# Patient Record
Sex: Female | Born: 1975 | Race: White | Hispanic: No | Marital: Married | Smoking: Never smoker
Health system: Southern US, Community
[De-identification: ages and names within clinical notes are randomized; demographics above are authoritative.]

## PROBLEM LIST (undated history)

## (undated) DIAGNOSIS — L409 Psoriasis, unspecified: Secondary | ICD-10-CM

---

## 1998-01-20 ENCOUNTER — Other Ambulatory Visit: Admission: RE | Admit: 1998-01-20 | Discharge: 1998-01-20 | Payer: Self-pay | Admitting: Obstetrics and Gynecology

## 1998-11-26 ENCOUNTER — Other Ambulatory Visit: Admission: RE | Admit: 1998-11-26 | Discharge: 1998-11-26 | Payer: Self-pay | Admitting: Obstetrics and Gynecology

## 1999-06-14 ENCOUNTER — Inpatient Hospital Stay (HOSPITAL_COMMUNITY): Admission: AD | Admit: 1999-06-14 | Discharge: 1999-06-16 | Payer: Self-pay | Admitting: Obstetrics and Gynecology

## 1999-07-26 ENCOUNTER — Other Ambulatory Visit: Admission: RE | Admit: 1999-07-26 | Discharge: 1999-07-26 | Payer: Self-pay | Admitting: Obstetrics and Gynecology

## 2000-01-28 ENCOUNTER — Other Ambulatory Visit: Admission: RE | Admit: 2000-01-28 | Discharge: 2000-01-28 | Payer: Self-pay | Admitting: Obstetrics and Gynecology

## 2000-09-19 ENCOUNTER — Other Ambulatory Visit: Admission: RE | Admit: 2000-09-19 | Discharge: 2000-09-19 | Payer: Self-pay | Admitting: Obstetrics and Gynecology

## 2001-12-07 ENCOUNTER — Inpatient Hospital Stay (HOSPITAL_COMMUNITY): Admission: AD | Admit: 2001-12-07 | Discharge: 2001-12-07 | Payer: Self-pay | Admitting: Obstetrics and Gynecology

## 2001-12-30 ENCOUNTER — Inpatient Hospital Stay (HOSPITAL_COMMUNITY): Admission: AD | Admit: 2001-12-30 | Discharge: 2002-01-01 | Payer: Self-pay | Admitting: Obstetrics & Gynecology

## 2002-02-04 ENCOUNTER — Other Ambulatory Visit: Admission: RE | Admit: 2002-02-04 | Discharge: 2002-02-04 | Payer: Self-pay | Admitting: Obstetrics and Gynecology

## 2003-02-17 ENCOUNTER — Other Ambulatory Visit: Admission: RE | Admit: 2003-02-17 | Discharge: 2003-02-17 | Payer: Self-pay | Admitting: Obstetrics and Gynecology

## 2003-04-30 HISTORY — PX: TONSILLECTOMY: SUR1361

## 2004-06-17 ENCOUNTER — Other Ambulatory Visit: Admission: RE | Admit: 2004-06-17 | Discharge: 2004-06-17 | Payer: Self-pay | Admitting: Obstetrics and Gynecology

## 2005-07-28 ENCOUNTER — Other Ambulatory Visit: Admission: RE | Admit: 2005-07-28 | Discharge: 2005-07-28 | Payer: Self-pay | Admitting: Obstetrics and Gynecology

## 2005-09-16 ENCOUNTER — Encounter: Admission: RE | Admit: 2005-09-16 | Discharge: 2005-09-16 | Payer: Self-pay | Admitting: Internal Medicine

## 2013-02-26 ENCOUNTER — Other Ambulatory Visit: Payer: Self-pay | Admitting: Obstetrics and Gynecology

## 2013-02-26 DIAGNOSIS — R928 Other abnormal and inconclusive findings on diagnostic imaging of breast: Secondary | ICD-10-CM

## 2013-03-13 ENCOUNTER — Other Ambulatory Visit: Payer: Self-pay | Admitting: Obstetrics and Gynecology

## 2013-03-13 ENCOUNTER — Ambulatory Visit
Admission: RE | Admit: 2013-03-13 | Discharge: 2013-03-13 | Disposition: A | Discharge status: Home / Self Care | Payer: BC Managed Care – PPO | Source: Ambulatory Visit | Attending: Obstetrics and Gynecology | Admitting: Obstetrics and Gynecology

## 2013-03-13 DIAGNOSIS — R928 Other abnormal and inconclusive findings on diagnostic imaging of breast: Secondary | ICD-10-CM

## 2013-03-28 ENCOUNTER — Ambulatory Visit
Admission: RE | Admit: 2013-03-28 | Discharge: 2013-03-28 | Disposition: A | Discharge status: Home / Self Care | Payer: BC Managed Care – PPO | Source: Ambulatory Visit | Attending: Obstetrics and Gynecology | Admitting: Obstetrics and Gynecology

## 2013-03-28 DIAGNOSIS — R928 Other abnormal and inconclusive findings on diagnostic imaging of breast: Secondary | ICD-10-CM

## 2013-06-03 ENCOUNTER — Ambulatory Visit (INDEPENDENT_AMBULATORY_CARE_PROVIDER_SITE_OTHER): Payer: BC Managed Care – PPO | Admitting: *Deleted

## 2013-06-03 DIAGNOSIS — Z23 Encounter for immunization: Secondary | ICD-10-CM

## 2016-08-25 ENCOUNTER — Other Ambulatory Visit: Payer: Self-pay | Admitting: Gastroenterology

## 2016-08-25 DIAGNOSIS — R110 Nausea: Secondary | ICD-10-CM

## 2016-08-25 DIAGNOSIS — R1033 Periumbilical pain: Secondary | ICD-10-CM

## 2016-08-25 DIAGNOSIS — R1013 Epigastric pain: Secondary | ICD-10-CM

## 2016-09-01 ENCOUNTER — Ambulatory Visit
Admission: RE | Admit: 2016-09-01 | Discharge: 2016-09-01 | Disposition: A | Discharge status: Home / Self Care | Payer: BLUE CROSS/BLUE SHIELD | Source: Ambulatory Visit | Attending: Gastroenterology | Admitting: Gastroenterology

## 2016-09-01 DIAGNOSIS — R1033 Periumbilical pain: Secondary | ICD-10-CM

## 2016-09-01 DIAGNOSIS — R110 Nausea: Secondary | ICD-10-CM

## 2016-09-01 DIAGNOSIS — R1013 Epigastric pain: Secondary | ICD-10-CM

## 2016-09-01 MED ORDER — IOPAMIDOL (ISOVUE-300) INJECTION 61%
100.0000 mL | Freq: Once | INTRAVENOUS | Status: AC | PRN
  Administered 2016-09-01: 100 mL via INTRAVENOUS

## 2016-09-09 ENCOUNTER — Encounter: Payer: Self-pay | Admitting: Internal Medicine

## 2016-09-09 ENCOUNTER — Ambulatory Visit (INDEPENDENT_AMBULATORY_CARE_PROVIDER_SITE_OTHER): Payer: BLUE CROSS/BLUE SHIELD | Admitting: Internal Medicine

## 2016-09-09 VITALS — BP 120/66 | HR 87 | Ht 65.0 in | Wt 143.6 lb

## 2016-09-09 DIAGNOSIS — K589 Irritable bowel syndrome without diarrhea: Secondary | ICD-10-CM

## 2016-09-09 DIAGNOSIS — R911 Solitary pulmonary nodule: Secondary | ICD-10-CM | POA: Diagnosis not present

## 2016-09-09 NOTE — Patient Instructions (Addendum)
The clue to your pain is that it migrates to different locations and gets better when supine    Migratory with a very limited distribution of pain locations, daytime, not exacerbated by ex or coughing, worse in sitting position, associated with generalized abd bloating, not present supine due to the dome effect of the diaphragm is  canceled in that position. Frequently these patients have had multiple negative GI workups and CT scans.  Treatment consists of avoiding foods that cause gas (especially [place] food, boiled eggs, beans and raw vegetables like spinach and salads)  and citrucel 1 heaping tsp twice daily with a large glass of water.  Pain should improve w/in 2 weeks and if not then consider further GI work up.      We will call you in a year to schedule a repeat CT entire chest following the Fleischner Society Guidelines  If you start having pain with breathing or unexplained cough please call me in meantime

## 2016-09-09 NOTE — Progress Notes (Signed)
   Subjective:    Patient ID: Patricia Norris, female    DOB: 10-01-1975,     MRN: [id number]  HPI  [age] yowf only second hand smoker (husband) with w/u for abd pain x summer 2017 with CT abdomen> LLL spn so referred to pulmonary clinic 09/09/2016 by Dr   Julio Sicksarol Curtis     09/09/2016 1st [place] office visit/ Kerry Chisolm   Chief Complaint  Patient presents with  . Pulmonary Consult    Referred by Julio Sicksarol Curtis, NP for lung nodule.    abd pain is migratory and very rarely noticed supine and some better on GERD rx but otherwise neg w/u per Dr Loreta AveMann  No sob, cough or h/o rheumatologic dz   No  Assoc cp or chest tightness, subjective wheeze or overt sinus   symptoms. No unusual exp hx or h/o childhood pna/ asthma or knowledge of premature birth.  Sleeping ok without nocturnal  or early am exacerbation  of respiratory  c/o's or need for noct saba. Also denies any obvious fluctuation of symptoms with weather or environmental changes or other aggravating or alleviating factors except as outlined above   Current Medications, Allergies, Complete Past Medical History, Past Surgical History, Family History, and Social History were reviewed in [facility] electronic medical record.     Review of Systems  Constitutional: Negative for chills, fever and unexpected weight change.  HENT: Negative for congestion, dental problem, ear pain, nosebleeds, postnasal drip, rhinorrhea, sinus pressure, sneezing, sore throat, trouble swallowing and voice change.   Eyes: Negative for visual disturbance.  Respiratory: Negative for cough, choking and shortness of breath.   Cardiovascular: Negative for chest pain and leg swelling.  Gastrointestinal: Positive for abdominal pain. Negative for diarrhea and vomiting.       Acid heartburn  Indigestion  Genitourinary: Negative for difficulty urinating.  Musculoskeletal: Positive for arthralgias.  Skin: Negative for rash.  Neurological: Negative for tremors, syncope  and headaches.  Hematological: Does not bruise/bleed easily.       Objective:   Physical Exam  amb wf nad  Wt Readings from Last 3 Encounters:  09/09/16 143 lb 9.6 oz (65.1 kg)    Vital signs reviewed    HEENT: nl dentition, turbinates, and oropharynx. Nl external ear canals without cough reflex   NECK :  without JVD/Nodes/TM/ nl carotid upstrokes bilaterally   LUNGS: no acc muscle use,  Nl contour chest which is clear to A and P bilaterally without cough on insp or exp maneuvers   CV:  RRR  no s3 or murmur or increase in P2, nad no edema   ABD:  soft and nontender with nl inspiratory excursion in the supine position. No bruits or organomegaly appreciated, bowel sounds nl  MS:  Nl gait/ ext warm without deformities, calf tenderness, cyanosis - ? Very subtle clubbing  No obvious joint restrictions   SKIN: warm and dry without lesions    NEURO:  alert, approp, nl sensorium with  no motor or cerebellar deficits apparent.           Assessment & Plan:

## 2016-09-10 DIAGNOSIS — K589 Irritable bowel syndrome without diarrhea: Secondary | ICD-10-CM | POA: Insufficient documentation

## 2016-09-10 NOTE — Assessment & Plan Note (Signed)
The pain was desribed to me as migratory and never present supine, never with pleuritic component, so most likely IBS > diet/option to use citrucel reviewed, emphasized the abd pain has nothing to do with the spn esp as does not have dx of inflammatory bowel dz (which sometime can be assoc with non-specific CT findings   F/u PC/ GI planned - see avs for instructions unique to this ov

## 2016-09-10 NOTE — Assessment & Plan Note (Addendum)
CT abd 09/01/16  = 3 mm LLL/ passive smoker> f/u 09/01/17   CT results reviewed with pt >>> Too small for PET or bx, not suspicious enough for excisional bx > really only option for now is follow the Fleischner society guidelines as rec by radiology.   We did not scan the entire lung and it's reasonable to do so in one year but this is as aggressive a f/u that would be indicated even if she were at high risk, which she is not  Discussed in detail all the  indications, usual  risks and alternatives  relative to the benefits with patient who agrees to proceed with conservative f/u as outlined  With f/u ct in one year  Total time devoted to counseling  > 50 % of 45 min initial office evaluation/ consultation:  reviewed case with pt/ discussion of options/alternatives/ personally creating written customized instructions  in presence of pt  then going over those specific  Instructions directly with the pt including how to use all of the meds but in particular covering each new medication in detail and the difference between the maintenance/automatic meds and the prns using an action plan format for the latter.  Please see AVS from this visit for a full list of these instructions which I personally wrote for this pt and  are unique to this visit.

## 2017-08-03 ENCOUNTER — Other Ambulatory Visit: Payer: Self-pay | Admitting: Internal Medicine

## 2017-08-03 DIAGNOSIS — R911 Solitary pulmonary nodule: Secondary | ICD-10-CM

## 2017-09-01 ENCOUNTER — Ambulatory Visit (HOSPITAL_COMMUNITY): Payer: BLUE CROSS/BLUE SHIELD

## 2019-08-01 ENCOUNTER — Other Ambulatory Visit: Payer: Self-pay | Admitting: Obstetrics and Gynecology

## 2019-08-01 DIAGNOSIS — N644 Mastodynia: Secondary | ICD-10-CM

## 2019-08-14 ENCOUNTER — Other Ambulatory Visit: Payer: Self-pay

## 2019-08-14 ENCOUNTER — Ambulatory Visit
Admission: RE | Admit: 2019-08-14 | Discharge: 2019-08-14 | Disposition: A | Discharge status: Home / Self Care | Payer: BC Managed Care – PPO | Source: Ambulatory Visit | Attending: Obstetrics and Gynecology | Admitting: Obstetrics and Gynecology

## 2019-08-14 DIAGNOSIS — N644 Mastodynia: Secondary | ICD-10-CM

## 2020-09-09 ENCOUNTER — Encounter: Payer: Self-pay | Admitting: Emergency Medicine

## 2020-09-09 ENCOUNTER — Other Ambulatory Visit: Payer: Self-pay

## 2020-09-09 ENCOUNTER — Ambulatory Visit
Admission: EM | Admit: 2020-09-09 | Discharge: 2020-09-09 | Disposition: A | Discharge status: Home / Self Care | Payer: BC Managed Care – PPO | Attending: Family Medicine | Admitting: Family Medicine

## 2020-09-09 ENCOUNTER — Ambulatory Visit: Payer: Self-pay

## 2020-09-09 DIAGNOSIS — R059 Cough, unspecified: Secondary | ICD-10-CM | POA: Diagnosis not present

## 2020-09-09 DIAGNOSIS — R062 Wheezing: Secondary | ICD-10-CM

## 2020-09-09 DIAGNOSIS — B349 Viral infection, unspecified: Secondary | ICD-10-CM | POA: Diagnosis not present

## 2020-09-09 DIAGNOSIS — J3489 Other specified disorders of nose and nasal sinuses: Secondary | ICD-10-CM

## 2020-09-09 DIAGNOSIS — R0981 Nasal congestion: Secondary | ICD-10-CM | POA: Diagnosis not present

## 2020-09-09 DIAGNOSIS — Z1152 Encounter for screening for COVID-19: Secondary | ICD-10-CM

## 2020-09-09 DIAGNOSIS — J029 Acute pharyngitis, unspecified: Secondary | ICD-10-CM

## 2020-09-09 DIAGNOSIS — J069 Acute upper respiratory infection, unspecified: Secondary | ICD-10-CM

## 2020-09-09 HISTORY — DX: Psoriasis, unspecified: L40.9

## 2020-09-09 MED ORDER — FLUCONAZOLE 150 MG PO TABS: ORAL_TABLET | ORAL | 0 refills | Status: DC

## 2020-09-09 MED ORDER — ALBUTEROL SULFATE HFA 108 (90 BASE) MCG/ACT IN AERS
2.0000 | INHALATION_SPRAY | Freq: Once | RESPIRATORY_TRACT | Status: AC
  Administered 2020-09-09: 2 via RESPIRATORY_TRACT

## 2020-09-09 MED ORDER — PREDNISONE 10 MG (21) PO TBPK: ORAL_TABLET | Freq: Every day | ORAL | 0 refills | Status: AC

## 2020-09-09 MED ORDER — AMOXICILLIN-POT CLAVULANATE 875-125 MG PO TABS: 1.0000 | ORAL_TABLET | Freq: Two times a day (BID) | ORAL | 0 refills | Status: AC

## 2020-09-09 NOTE — Discharge Instructions (Addendum)
I have sent in a prednisone taper for you to take for 6 days. 6 tablets on day one, 5 tablets on day two, 4 tablets on day three, 3 tablets on day four, 2 tablets on day five, and 1 tablet on day six.  I have sent in Augmentin for you to take twice a day for 7 days. May get this filled if symptoms are persisting and Covid testing is negative.  I have sent in fluconazole in case of yeast. Take one tablet at the onset of symptoms. If symptoms are still present in 3 days, take the second tablet.   Your COVID test is pending.  You should self quarantine until the test result is back.    Take Tylenol or ibuprofen as needed for fever or discomfort.  Rest and keep yourself hydrated.    Follow-up with your primary care provider if your symptoms are not improving.

## 2020-09-09 NOTE — ED Triage Notes (Signed)
Pt presents with nasal congestion, cough, sore throat and sinus pressure x 3 days. Denies fever.

## 2020-09-09 NOTE — ED Provider Notes (Signed)
[facility]   [id number] 09/09/20 Arrival Time: 1044   CC: COVID symptoms  SUBJECTIVE: History from: patient.  Patricia Norris is a 45 y.o. female who presents with cough, nasal congestion, sinus pressure, wheezing, sore throat for the last 3 days. Denies sick exposure to COVID, flu or strep. Denies recent travel. Has negative history of Covid. Has completed Covid vaccines. Has not taken OTC medications for this. Cough and wheezing are worse with activity. Denies previous symptoms in the past. Denies fever, chills, fatigue, sinus pain, SOB, chest pain, nausea, changes in bowel or bladder habits.    ROS: As per HPI.  All other pertinent ROS negative.     Past Medical History:  Diagnosis Date  . Psoriasis    Past Surgical History:  Procedure Laterality Date  . TONSILLECTOMY  04/30/2003   No Known Allergies No current facility-administered medications on file prior to encounter.   Current Outpatient Medications on File Prior to Encounter  Medication Sig Dispense Refill  . Norethin-Eth Estrad-Fe Biphas (LO LOESTRIN FE PO) Take by mouth as directed.    Marland Kitchen omeprazole (PRILOSEC) 40 MG capsule Take 1 capsule by mouth daily.  11   Social History   Socioeconomic History  . Marital status: Married    Spouse name: Not on file  . Number of children: Not on file  . Years of education: Not on file  . Highest education level: Not on file  Occupational History  . Not on file  Tobacco Use  . Smoking status: Never Smoker  . Smokeless tobacco: Never Used  Substance and Sexual Activity  . Alcohol use: Not Currently  . Drug use: Never  . Sexual activity: Not on file  Other Topics Concern  . Not on file  Social History Narrative  . Not on file   Social Determinants of Health   Financial Resource Strain: Not on file  Food Insecurity: Not on file  Transportation Needs: Not on file  Physical Activity: Not on file  Stress: Not on file  Social Connections: Not on file   Intimate Partner Violence: Not on file   Family History  Problem Relation Age of Onset  . Colon cancer Maternal Aunt     OBJECTIVE:  Vitals:   09/09/20 1110 09/09/20 1114 09/09/20 1117  BP:  128/78   Pulse:  72   Resp:  20   Temp:  98.9 F (37.2 C)   TempSrc:  Oral   SpO2:   97%  Weight: 140 lb (63.5 kg)    Height: 5\' 5"  (1.651 m)       General appearance: alert; appears fatigued, but nontoxic; speaking in full sentences and tolerating own secretions HEENT: NCAT; Ears: EACs clear, TMs pearly gray; Eyes: PERRL.  EOM grossly intact. Sinuses: nontender; Nose: nares patent with clear rhinorrhea, Throat: oropharynx erythematous, cobblestoning present, tonsils non erythematous or enlarged, uvula midline  Neck: supple without LAD Lungs: unlabored respirations, symmetrical air entry; cough: mild; no respiratory distress; diffuse wheezing throughout bilateral lung fields Heart: regular rate and rhythm.  Radial pulses 2+ symmetrical bilaterally Skin: warm and dry Psychological: alert and cooperative; normal mood and affect  LABS:  No results found for this or any previous visit (from the past 24 hour(s)).   ASSESSMENT & PLAN:  1. Viral illness   2. Encounter for screening for COVID-19   3. Nasal congestion   4. Cough   5. Sore throat   6. Sinus pressure   7. Wheezing   8.  Upper respiratory tract infection, unspecified type     Meds ordered this encounter  Medications  . predniSONE (STERAPRED UNI-PAK 21 TAB) 10 MG (21) TBPK tablet    Sig: Take by mouth daily for 6 days. Take 6 tablets on day 1, 5 tablets on day 2, 4 tablets on day 3, 3 tablets on day 4, 2 tablets on day 5, 1 tablet on day 6    Dispense:  21 tablet    Refill:  0    Order Specific Question:   Supervising Provider    Answer:   Merrilee Jansky [id number]  . amoxicillin-clavulanate (AUGMENTIN) 875-125 MG tablet    Sig: Take 1 tablet by mouth 2 (two) times daily for 7 days.    Dispense:  14 tablet     Refill:  0    Fill 09/12/20    Order Specific Question:   Supervising Provider    Answer:   Merrilee Jansky [[id number]]  . fluconazole (DIFLUCAN) 150 MG tablet    Sig: Take one tablet at the onset of symptoms. If symptoms are still present 3 days later, take the second tablet.    Dispense:  2 tablet    Refill:  0    Order Specific Question:   Supervising Provider    Answer:   Merrilee Jansky [id number]  . albuterol (VENTOLIN HFA) 108 (90 Base) MCG/ACT inhaler 2 puff   Prescribed steroid taper Albuterol inhaler given in office today Prescribed Augmentin to get field on 09/12/2020, if symptoms are persisting and COVID is negative Prescribed fluconazole in case of yeast Continue supportive care at home COVID and flu testing ordered.  It will take between 2-3 days for test results. Someone will contact you regarding abnormal results.   Work note provided Patient should remain in quarantine until they have received Covid results.  If negative you may resume normal activities (go back to work/school) while practicing hand hygiene, social distance, and mask wearing.  If positive, patient should remain in quarantine for at least 5 days from symptom onset AND greater than 72 hours after symptoms resolution (absence of fever without the use of fever-reducing medication and improvement in respiratory symptoms), whichever is longer Get plenty of rest and push fluids Use OTC zyrtec for nasal congestion, runny nose, and/or sore throat Use OTC flonase for nasal congestion and runny nose Use medications daily for symptom relief Use OTC medications like ibuprofen or tylenol as needed fever or pain Call or go to the ED if you have any new or worsening symptoms such as fever, worsening cough, shortness of breath, chest tightness, chest pain, turning blue, changes in mental status.  Reviewed expectations re: course of current medical issues. Questions answered. Outlined signs and symptoms indicating need for  more acute intervention. Patient verbalized understanding. After Visit Summary given.         Moshe Cipro, NP 09/09/20 1157

## 2020-09-11 LAB — NOVEL CORONAVIRUS, NAA: SARS-CoV-2, NAA: NOT DETECTED

## 2020-09-11 LAB — SARS-COV-2, NAA 2 DAY TAT

## 2020-11-12 ENCOUNTER — Telehealth: Payer: Self-pay | Admitting: Orthopaedic Surgery

## 2020-11-12 NOTE — Telephone Encounter (Signed)
(  cont'd) Patient inquired about appointment; in discussing it further, patient relayed she had been seen for this back issue in the past by [facility]); said she will go ahead and call them to request a sooner appointment. Will call back if unable to be scheduled soon there.

## 2020-11-12 NOTE — Telephone Encounter (Signed)
Patient called with question regarding a back pain issue she has been having; states fell many years ago, and said it is hurting  again

## 2021-02-18 ENCOUNTER — Other Ambulatory Visit (HOSPITAL_COMMUNITY)
Admission: RE | Admit: 2021-02-18 | Discharge: 2021-02-18 | Disposition: A | Discharge status: Home / Self Care | Payer: BC Managed Care – PPO | Source: Ambulatory Visit | Attending: Dermatology | Admitting: Dermatology

## 2021-02-18 ENCOUNTER — Other Ambulatory Visit: Payer: Self-pay

## 2021-02-18 DIAGNOSIS — L400 Psoriasis vulgaris: Secondary | ICD-10-CM | POA: Insufficient documentation

## 2021-02-18 DIAGNOSIS — Z79899 Other long term (current) drug therapy: Secondary | ICD-10-CM | POA: Diagnosis present

## 2021-02-22 LAB — QUANTIFERON-TB GOLD PLUS: QuantiFERON-TB Gold Plus: NEGATIVE

## 2021-02-22 LAB — QUANTIFERON-TB GOLD PLUS (RQFGPL)
QuantiFERON Mitogen Value: >10 IU/mL
QuantiFERON Nil Value: 0.04 IU/mL
QuantiFERON TB1 Ag Value: 0.02 IU/mL
QuantiFERON TB2 Ag Value: 0 IU/mL

## 2021-04-21 IMAGING — MG DIGITAL DIAGNOSTIC BILAT W/ TOMO W/ CAD
6 of 10 series · 6 of 30 positions shown · non-contrast
Comparison: Previous exam(s).

CLINICAL DATA: Patient complains of focal pain in the 10 o'clock
region of the right breast.

EXAM:
DIGITAL DIAGNOSTIC BILATERAL MAMMOGRAM WITH TOMO
ULTRASOUND RIGHT BREAST

[L CC synth-2D]
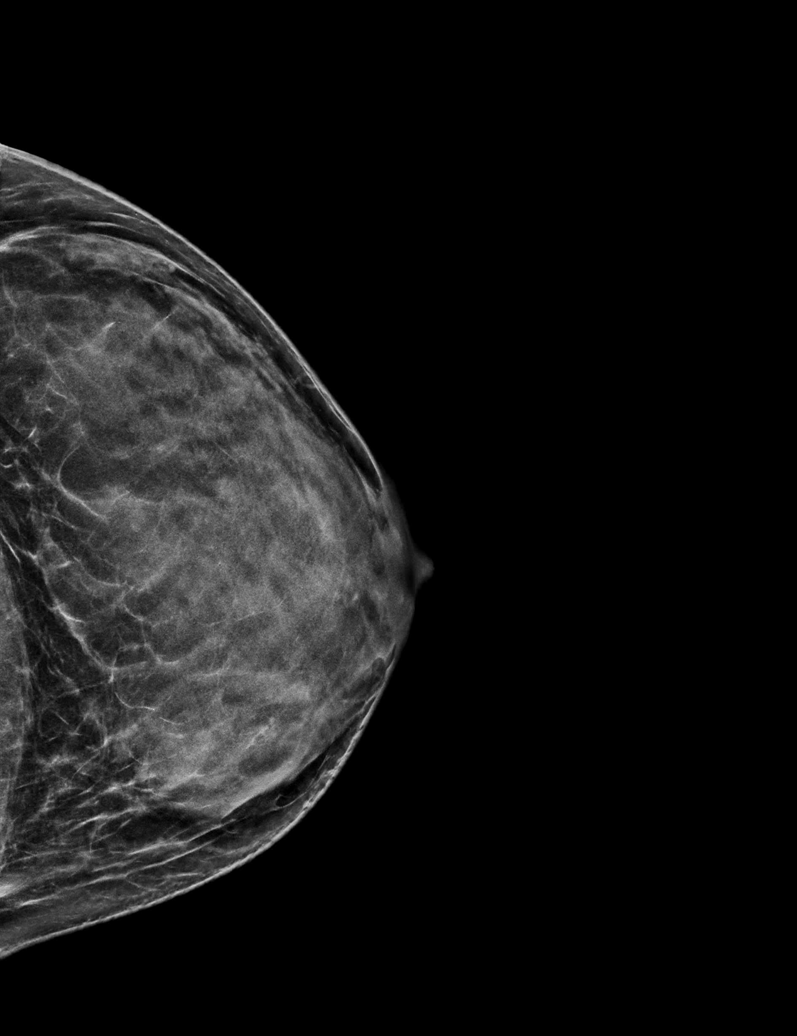

[R TAN synth-2D]
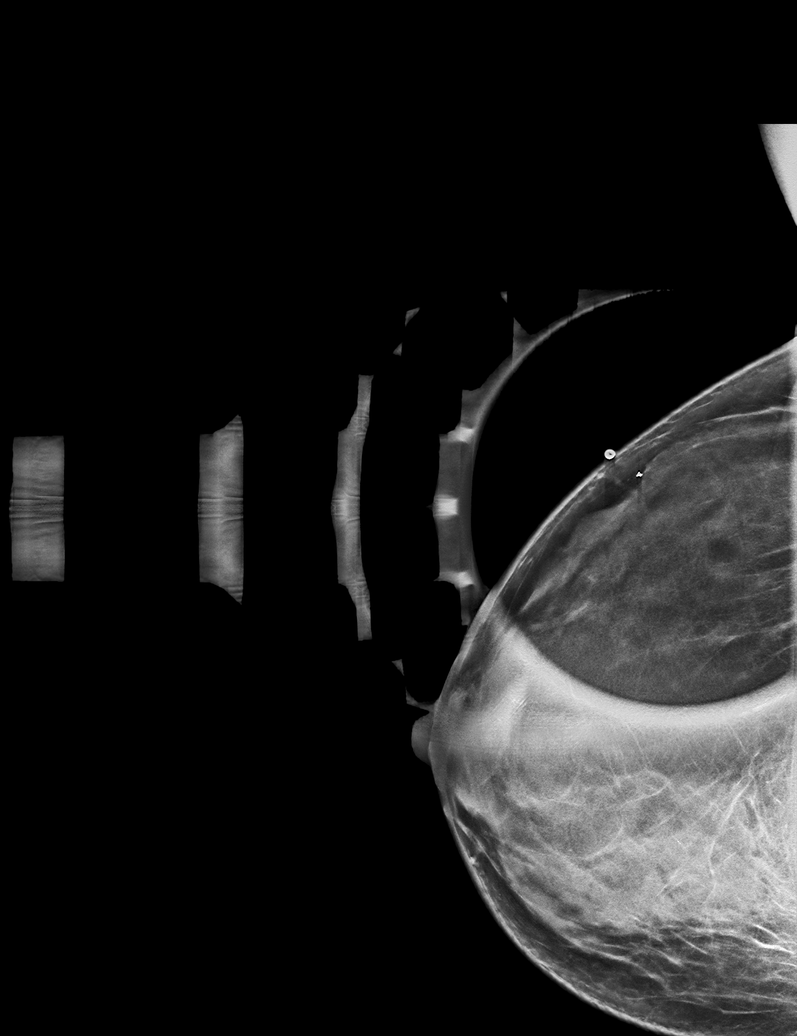

[R CC synth-2D]
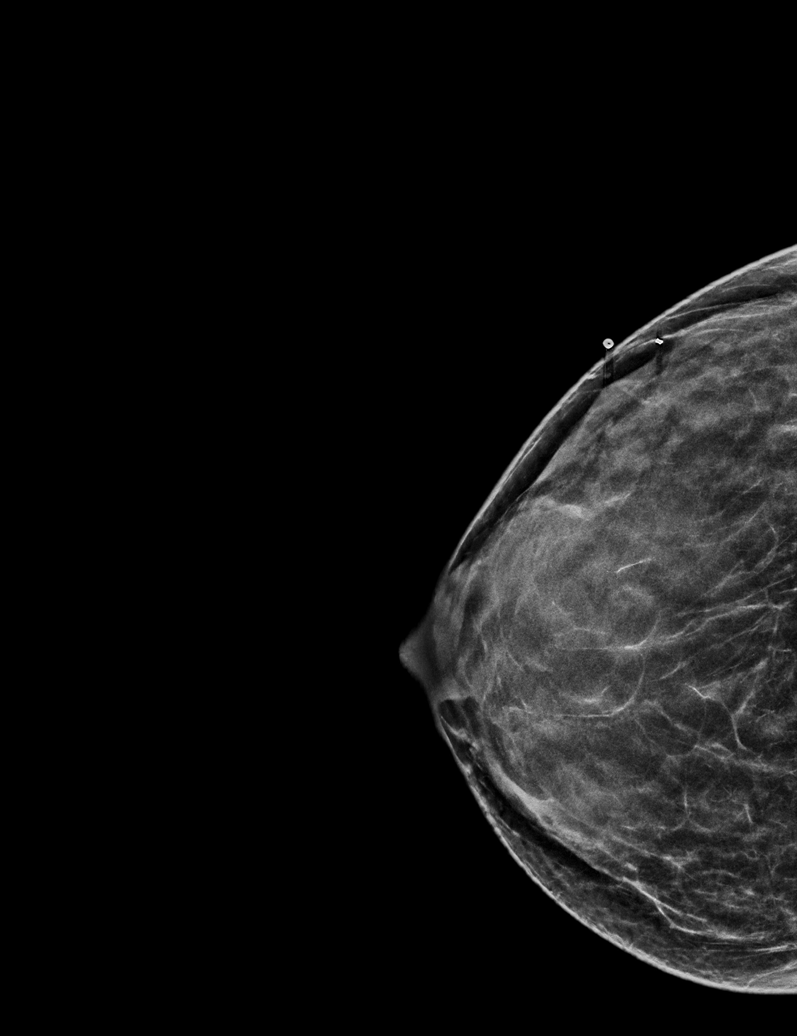

[L MLO synth-2D]
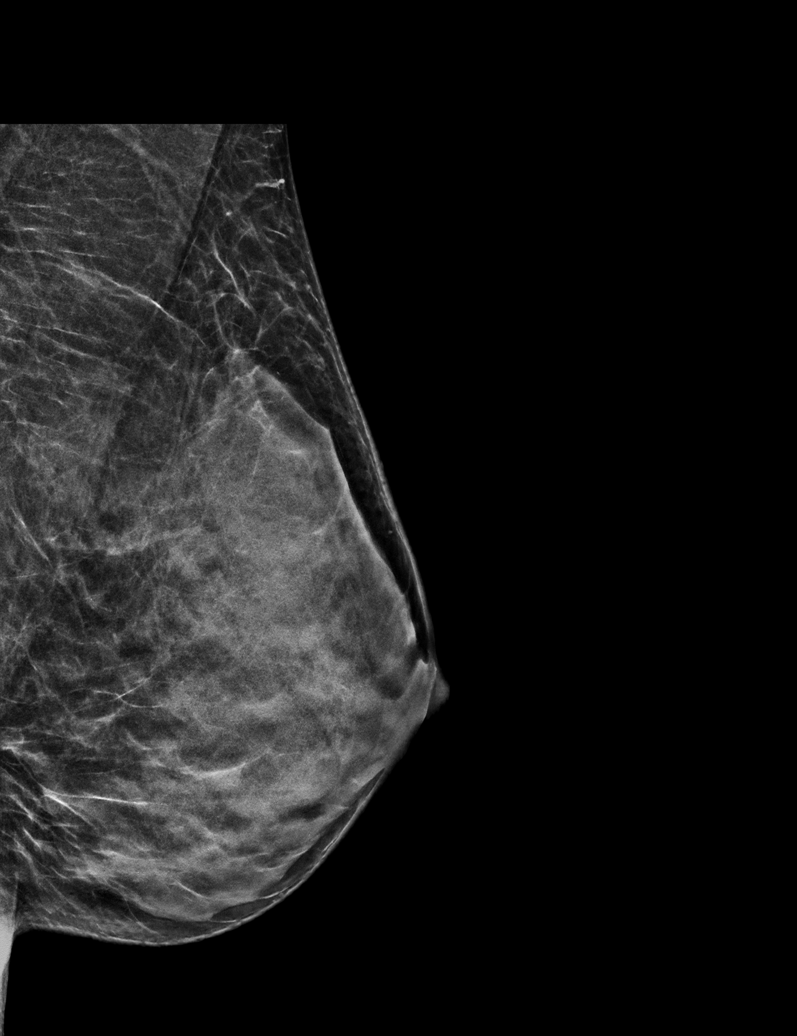

[R MLO synth-2D]
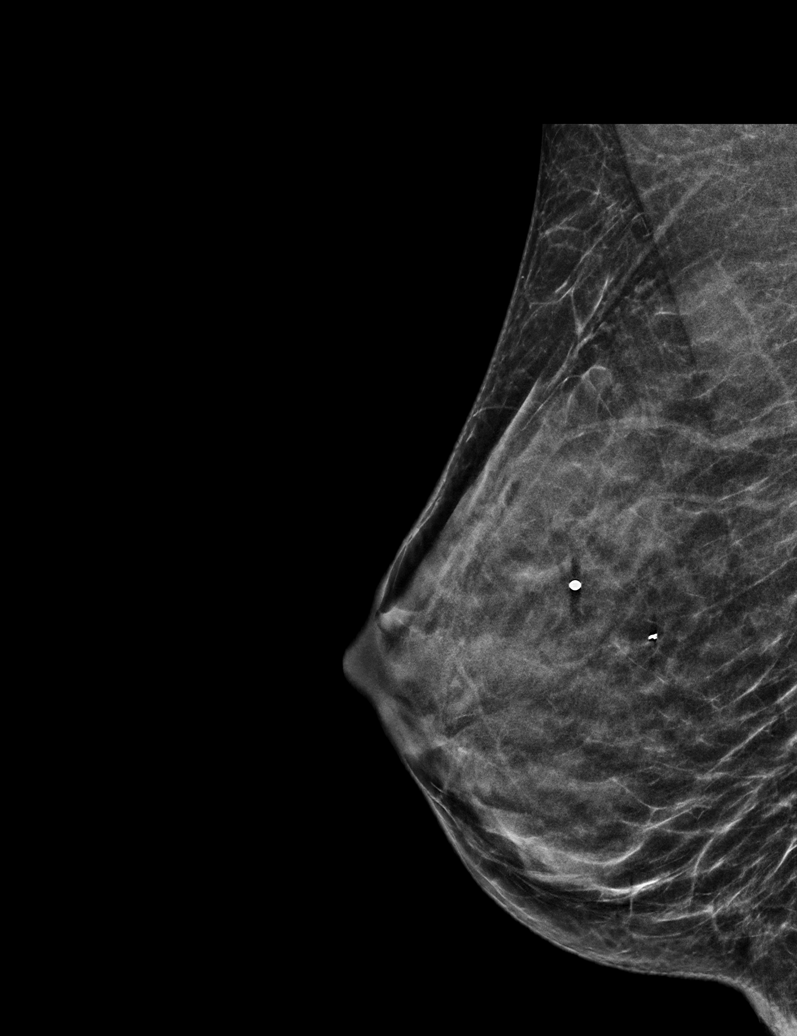

[L MLO tomo · tomo slice 25/48.0]
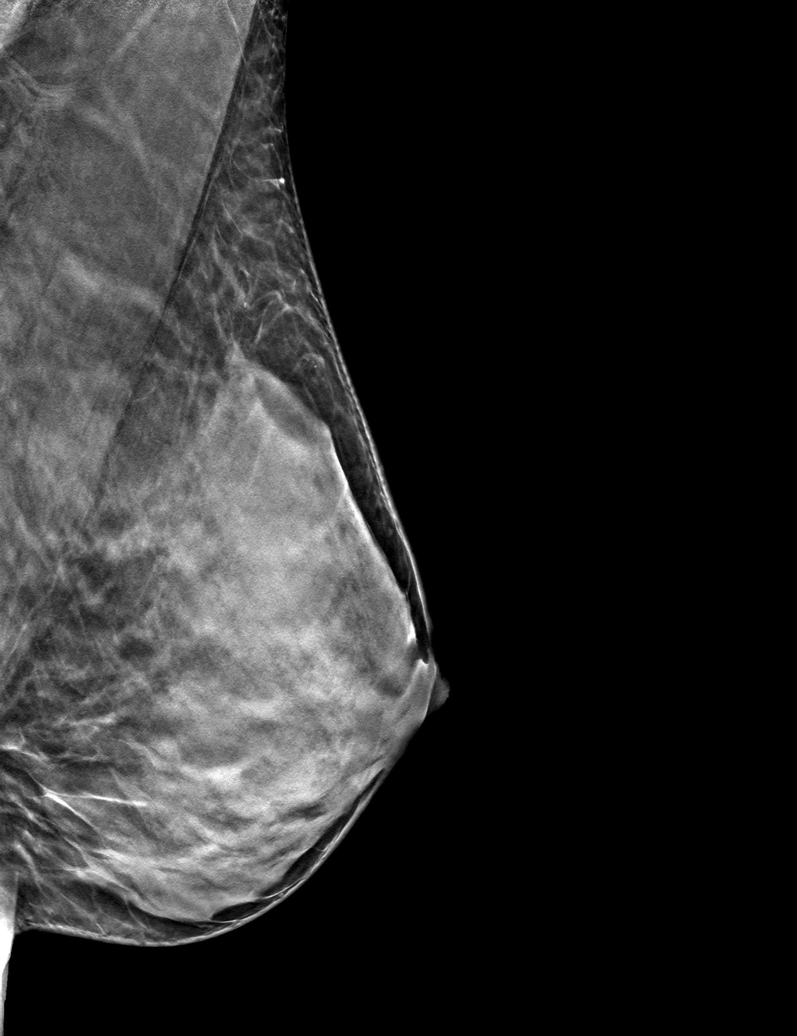

[6 of 30 positions shown; findings below may reference images not displayed]

ACR Breast Density Category d: The breast tissue is extremely dense,
which lowers the sensitivity of mammography.
FINDINGS: No suspicious mass, malignant type microcalcifications or distortion
detected in either breast.

On physical exam, I do not palpate a mass in the 10 o'clock region
of the right breast 4 cm from the nipple.

Targeted ultrasound is performed, showing normal tissue in the area
of clinical concern in the right breast at 10 o'clock 4 cm from the
nipple.
IMPRESSION: No evidence of malignancy in either breast.

RECOMMENDATION:
Bilateral screening mammogram in 1 year is recommended.

I have discussed the findings and recommendations with the patient.
If applicable, a reminder letter will be sent to the patient
regarding the next appointment.

BI-RADS CATEGORY  1: Negative.

## 2021-06-02 ENCOUNTER — Encounter: Payer: Self-pay | Admitting: Emergency Medicine

## 2021-06-02 ENCOUNTER — Ambulatory Visit
Admission: EM | Admit: 2021-06-02 | Discharge: 2021-06-02 | Disposition: A | Discharge status: Home / Self Care | Payer: BC Managed Care – PPO | Attending: Family Medicine | Admitting: Family Medicine

## 2021-06-02 ENCOUNTER — Other Ambulatory Visit: Payer: Self-pay

## 2021-06-02 DIAGNOSIS — J0110 Acute frontal sinusitis, unspecified: Secondary | ICD-10-CM

## 2021-06-02 MED ORDER — AMOXICILLIN 875 MG PO TABS: 875.0000 mg | ORAL_TABLET | Freq: Two times a day (BID) | ORAL | 0 refills | Status: AC

## 2021-06-02 NOTE — ED Triage Notes (Signed)
Pt here for facial pain and sinus pressure x 2 weeks with hx of sinus infection is past

## 2021-06-05 NOTE — ED Provider Notes (Signed)
  [facility]   [id number] 06/02/21 Arrival Time: 1149  ASSESSMENT & PLAN:  1. Acute non-recurrent frontal sinusitis    Begin: Meds ordered this encounter  Medications   amoxicillin (AMOXIL) 875 MG tablet    Sig: Take 1 tablet (875 mg total) by mouth 2 (two) times daily for 10 days.    Dispense:  20 tablet    Refill:  0   Discussed typical duration of symptoms. OTC symptom care as needed. Ensure adequate fluid intake and rest.   Follow-up Information     [facility].   Specialty: Urgent Care Why: As needed. Contact information: [address], [address] [place] [address] [phone]                Reviewed expectations re: course of current medical issues. Questions answered. Outlined signs and symptoms indicating need for more acute intervention. Patient verbalized understanding. After Visit Summary given.   SUBJECTIVE: History from: patient.  Patricia Norris is a 45 y.o. female who presents with complaint of nasal congestion, post-nasal drainage, and sinus pain. Onset gradual,  2 w ago . Respiratory symptoms: none. Fever: absent. Overall normal PO intake without n/v. OTC treatment: without relief. Seasonal allergies: no. History of frequent sinus infections: no. No specific aggravating or alleviating factors reported.  Social History   Tobacco Use  Smoking Status Never  Smokeless Tobacco Never    OBJECTIVE:  Vitals:   06/02/21 1344  BP: 120/69  Pulse: 73  Resp: 16  Temp: 98.6 F (37 C)  TempSrc: Oral  SpO2: 94%     General appearance: alert; no distress HEENT: nasal congestion; clear runny nose; throat irritation secondary to post-nasal drainage; frontal tenderness to palpation; turbinates boggy Neck: supple without LAD; trachea midline Lungs: unlabored respirations, symmetrical air entry; cough: absent; no respiratory distress Skin: warm and dry Psychological: alert and cooperative;  normal mood and affect  No Known Allergies  Past Medical History:  Diagnosis Date   Psoriasis    Family History  Problem Relation Age of Onset   Colon cancer Maternal Aunt    Social History   Socioeconomic History   Marital status: Married    Spouse name: Not on file   Number of children: Not on file   Years of education: Not on file   Highest education level: Not on file  Occupational History   Not on file  Tobacco Use   Smoking status: Never   Smokeless tobacco: Never  Substance and Sexual Activity   Alcohol use: Not Currently   Drug use: Never   Sexual activity: Not on file  Other Topics Concern   Not on file  Social History Narrative   Not on file   Social Determinants of Health   Financial Resource Strain: Not on file  Food Insecurity: Not on file  Transportation Needs: Not on file  Physical Activity: Not on file  Stress: Not on file  Social Connections: Not on file  Intimate Partner Violence: Not on file             Mardella Layman, MD 06/05/21 1032

## 2021-06-16 DIAGNOSIS — N926 Irregular menstruation, unspecified: Secondary | ICD-10-CM | POA: Diagnosis not present

## 2021-06-16 DIAGNOSIS — F411 Generalized anxiety disorder: Secondary | ICD-10-CM | POA: Diagnosis not present

## 2021-06-16 DIAGNOSIS — G4700 Insomnia, unspecified: Secondary | ICD-10-CM | POA: Diagnosis not present

## 2021-11-09 DIAGNOSIS — J0110 Acute frontal sinusitis, unspecified: Secondary | ICD-10-CM | POA: Diagnosis not present

## 2021-11-09 DIAGNOSIS — R110 Nausea: Secondary | ICD-10-CM | POA: Diagnosis not present

## 2021-11-09 DIAGNOSIS — J302 Other seasonal allergic rhinitis: Secondary | ICD-10-CM | POA: Diagnosis not present

## 2021-12-13 DIAGNOSIS — Z1231 Encounter for screening mammogram for malignant neoplasm of breast: Secondary | ICD-10-CM | POA: Diagnosis not present

## 2021-12-13 DIAGNOSIS — Z1321 Encounter for screening for nutritional disorder: Secondary | ICD-10-CM | POA: Diagnosis not present

## 2021-12-13 DIAGNOSIS — Z124 Encounter for screening for malignant neoplasm of cervix: Secondary | ICD-10-CM | POA: Diagnosis not present

## 2021-12-13 DIAGNOSIS — Z01419 Encounter for gynecological examination (general) (routine) without abnormal findings: Secondary | ICD-10-CM | POA: Diagnosis not present

## 2021-12-13 DIAGNOSIS — Z130 Encounter for screening for diseases of the blood and blood-forming organs and certain disorders involving the immune mechanism: Secondary | ICD-10-CM | POA: Diagnosis not present

## 2021-12-13 DIAGNOSIS — Z6827 Body mass index (BMI) 27.0-27.9, adult: Secondary | ICD-10-CM | POA: Diagnosis not present

## 2021-12-13 DIAGNOSIS — Z1329 Encounter for screening for other suspected endocrine disorder: Secondary | ICD-10-CM | POA: Diagnosis not present

## 2021-12-13 DIAGNOSIS — Z131 Encounter for screening for diabetes mellitus: Secondary | ICD-10-CM | POA: Diagnosis not present

## 2021-12-13 DIAGNOSIS — Z13228 Encounter for screening for other metabolic disorders: Secondary | ICD-10-CM | POA: Diagnosis not present

## 2022-02-15 DIAGNOSIS — S99922A Unspecified injury of left foot, initial encounter: Secondary | ICD-10-CM | POA: Diagnosis not present

## 2022-02-15 DIAGNOSIS — M79672 Pain in left foot: Secondary | ICD-10-CM | POA: Diagnosis not present

## 2022-02-17 ENCOUNTER — Other Ambulatory Visit: Payer: Self-pay | Admitting: Podiatry

## 2022-02-17 ENCOUNTER — Other Ambulatory Visit (HOSPITAL_COMMUNITY): Payer: Self-pay | Admitting: Podiatry

## 2022-02-17 DIAGNOSIS — S99922A Unspecified injury of left foot, initial encounter: Secondary | ICD-10-CM

## 2022-03-02 ENCOUNTER — Ambulatory Visit (HOSPITAL_COMMUNITY): Payer: BC Managed Care – PPO

## 2022-03-09 ENCOUNTER — Ambulatory Visit (HOSPITAL_COMMUNITY): Admission: RE | Admit: 2022-03-09 | Payer: BC Managed Care – PPO | Source: Ambulatory Visit

## 2022-03-09 ENCOUNTER — Encounter (HOSPITAL_COMMUNITY): Payer: Self-pay

## 2022-03-15 DIAGNOSIS — M67472 Ganglion, left ankle and foot: Secondary | ICD-10-CM | POA: Diagnosis not present

## 2022-03-15 DIAGNOSIS — M2012 Hallux valgus (acquired), left foot: Secondary | ICD-10-CM | POA: Diagnosis not present

## 2022-03-15 DIAGNOSIS — M7752 Other enthesopathy of left foot: Secondary | ICD-10-CM | POA: Diagnosis not present

## 2022-03-15 DIAGNOSIS — M19072 Primary osteoarthritis, left ankle and foot: Secondary | ICD-10-CM | POA: Diagnosis not present

## 2022-03-17 DIAGNOSIS — M7742 Metatarsalgia, left foot: Secondary | ICD-10-CM | POA: Diagnosis not present

## 2022-03-17 DIAGNOSIS — M778 Other enthesopathies, not elsewhere classified: Secondary | ICD-10-CM | POA: Diagnosis not present

## 2022-03-31 DIAGNOSIS — M7752 Other enthesopathy of left foot: Secondary | ICD-10-CM | POA: Diagnosis not present

## 2022-03-31 DIAGNOSIS — M79672 Pain in left foot: Secondary | ICD-10-CM | POA: Diagnosis not present

## 2022-05-25 ENCOUNTER — Other Ambulatory Visit (HOSPITAL_COMMUNITY)
Admission: RE | Admit: 2022-05-25 | Discharge: 2022-05-25 | Disposition: A | Discharge status: Home / Self Care | Payer: BC Managed Care – PPO | Source: Ambulatory Visit | Attending: Internal Medicine | Admitting: Internal Medicine

## 2022-05-25 DIAGNOSIS — L400 Psoriasis vulgaris: Secondary | ICD-10-CM | POA: Insufficient documentation

## 2022-05-25 LAB — CBC WITH DIFFERENTIAL/PLATELET
Abs Immature Granulocytes: 0.02 10*3/uL (ref 0.00–0.07)
Basophils Absolute: 0 10*3/uL (ref 0.0–0.1)
Basophils Relative: 0 %
Eosinophils Absolute: 0.2 10*3/uL (ref 0.0–0.5)
Eosinophils Relative: 2 %
HCT: 39.1 % (ref 36.0–46.0)
Hemoglobin: 13 g/dL (ref 12.0–15.0)
Immature Granulocytes: 0 %
Lymphocytes Relative: 17 %
Lymphs Abs: 1.6 10*3/uL (ref 0.7–4.0)
MCH: 30.7 pg (ref 26.0–34.0)
MCHC: 33.2 g/dL (ref 30.0–36.0)
MCV: 92.4 fL (ref 80.0–100.0)
Monocytes Absolute: 0.7 10*3/uL (ref 0.1–1.0)
Monocytes Relative: 8 %
Neutro Abs: 6.9 10*3/uL (ref 1.7–7.7)
Neutrophils Relative %: 73 %
Platelets: 320 10*3/uL (ref 150–400)
RBC: 4.23 MIL/uL (ref 3.87–5.11)
RDW: 11.8 % (ref 11.5–15.5)
WBC: 9.4 10*3/uL (ref 4.0–10.5)
nRBC: 0 % (ref 0.0–0.2)

## 2022-05-25 LAB — COMPREHENSIVE METABOLIC PANEL
ALT: 21 U/L (ref 0–44)
AST: 19 U/L (ref 15–41)
Albumin: 4.5 g/dL (ref 3.5–5.0)
Alkaline Phosphatase: 44 U/L (ref 38–126)
Anion gap: 11 (ref 5–15)
BUN: 9 mg/dL (ref 6–20)
CO2: 23 mmol/L (ref 22–32)
Calcium: 8.9 mg/dL (ref 8.9–10.3)
Chloride: 104 mmol/L (ref 98–111)
Creatinine, Ser: 0.67 mg/dL (ref 0.44–1.00)
GFR, Estimated: >60 mL/min (ref >60)
Glucose, Bld: 105 mg/dL — ABNORMAL HIGH (ref 70–99)
Potassium: 3.7 mmol/L (ref 3.5–5.1)
Sodium: 138 mmol/L (ref 135–145)
Total Bilirubin: 0.6 mg/dL (ref 0.3–1.2)
Total Protein: 7.3 g/dL (ref 6.5–8.1)

## 2022-05-31 LAB — QUANTIFERON-TB GOLD PLUS (RQFGPL)
QuantiFERON Mitogen Value: >10 IU/mL
QuantiFERON Nil Value: 0 IU/mL
QuantiFERON TB1 Ag Value: 0.01 IU/mL
QuantiFERON TB2 Ag Value: 0.01 IU/mL

## 2022-05-31 LAB — QUANTIFERON-TB GOLD PLUS: QuantiFERON-TB Gold Plus: NEGATIVE

## 2022-06-02 DIAGNOSIS — L400 Psoriasis vulgaris: Secondary | ICD-10-CM | POA: Diagnosis not present

## 2022-06-07 DIAGNOSIS — M778 Other enthesopathies, not elsewhere classified: Secondary | ICD-10-CM | POA: Diagnosis not present

## 2022-06-07 DIAGNOSIS — M7742 Metatarsalgia, left foot: Secondary | ICD-10-CM | POA: Diagnosis not present

## 2022-07-04 ENCOUNTER — Ambulatory Visit (INDEPENDENT_AMBULATORY_CARE_PROVIDER_SITE_OTHER): Payer: BC Managed Care – PPO

## 2022-07-04 ENCOUNTER — Ambulatory Visit: Payer: BC Managed Care – PPO | Admitting: Orthopedic Surgery

## 2022-07-04 ENCOUNTER — Encounter: Payer: Self-pay | Admitting: Orthopedic Surgery

## 2022-07-04 VITALS — BP 130/76 | HR 72 | Ht 65.0 in | Wt 155.0 lb

## 2022-07-04 DIAGNOSIS — S93491A Sprain of other ligament of right ankle, initial encounter: Secondary | ICD-10-CM | POA: Diagnosis not present

## 2022-07-04 DIAGNOSIS — M25571 Pain in right ankle and joints of right foot: Secondary | ICD-10-CM

## 2022-07-04 NOTE — Progress Notes (Signed)
Office Visit Note   Patient: Patricia Norris           Date of Birth: 1976/04/30           MRN: [id number] Visit Date: 07/04/2022              Requested by: No referring provider defined for this encounter. PCP: Pcp, No   Assessment & Plan:  46 year old female with high ankle sprain and low ankle sprain with no medial pain or tenderness negative ankle x-ray mortise intact  Outside images negative for tib-fib fracture high fib fracture   Visit Diagnoses:  1. High ankle sprain of right lower extremity, initial encounter   2. Pain in right ankle and joints of right foot     Plan: Recommend nonweightbearing  Protection rest ice compression elevation  Return in 3 weeks  Follow-Up Instructions: Return in about 3 weeks (around 07/25/2022) for FOLLOW UP, RIGHT, ANKLE.   Orders:  Orders Placed This Encounter  Procedures   DG Ankle Complete Right   No orders of the defined types were placed in this encounter.     Procedures: No procedures performed   Clinical Data: No additional findings.   Subjective: Chief Complaint  Patient presents with   Right Ankle - Fracture    Patricia Norris - 06/30/22 missed a step coming out of camper and fell    46 yo female missed the last step of the camper   Seen at urgent care in [facility]  The report says there is a possible avulsion frx of the right fibula  She c/o pain only on the lateral side of the ankle radiating up the right leg   She's NWB with crutches     I was able to review the outside imaging and it was 2 x-rays right tibia and fibula and although the report which I read said there was an avulsion fracture of the fibula I did not see any fracture at all    Review of Systems  Neurological:  Negative for numbness.  All other systems reviewed and are negative.    Objective: Vital Signs: BP 130/76   Pulse 72   Ht 5\' 5"  (1.651 m)   Wt 155 lb (70.3 kg)   BMI 25.79 kg/m   Physical Exam Vitals and nursing note reviewed.   Constitutional:      General: She is not in acute distress.    Appearance: Normal appearance. She is not ill-appearing, toxic-appearing or diaphoretic.  HENT:     Head: Normocephalic and atraumatic.     Nose: Nose normal. No congestion or rhinorrhea.  Eyes:     General: No scleral icterus.       Right eye: No discharge.        Left eye: No discharge.     Extraocular Movements: Extraocular movements intact.     Conjunctiva/sclera: Conjunctivae normal.     Pupils: Pupils are equal, round, and reactive to light.  Cardiovascular:     Pulses: Normal pulses.  Pulmonary:     Effort: Pulmonary effort is normal.     Breath sounds: No wheezing.  Skin:    Capillary Refill: Capillary refill takes less than 2 seconds.     Findings: Bruising present. No lesion or rash.  Neurological:     General: No focal deficit present.     Mental Status: She is alert and oriented to person, place, and time.     Sensory: No sensory deficit.     Motor:  No weakness.     Gait: Gait abnormal.  Psychiatric:        Mood and Affect: Mood normal.        Behavior: Behavior normal.        Thought Content: Thought content normal.        Judgment: Judgment normal.     Right Ankle Exam   Tenderness  The patient is experiencing tenderness in the ATF and CF (Deltoid nontender, distal pulses tender as). Swelling: moderate  Range of Motion  Dorsiflexion:  abnormal  Plantar flexion:  abnormal   Muscle Strength  Right ankle normal muscle strength: Dorsiflexion and plantarflexion weakness is secondary to pain and swelling.  Other  Scars: absent Sensation: normal Pulse: present       Specialty Comments:  No specialty comments available.  Imaging: No results found.   PMFS History: Patient Active Problem List   Diagnosis Date Noted   Irritable bowel syndrome 09/10/2016   Solitary pulmonary nodule 09/09/2016   Past Medical History:  Diagnosis Date   Psoriasis     Family History  Problem  Relation Age of Onset   Colon cancer Maternal Aunt     Past Surgical History:  Procedure Laterality Date   TONSILLECTOMY  04/30/2003   Social History   Occupational History   Not on file  Tobacco Use   Smoking status: Never   Smokeless tobacco: Never  Substance and Sexual Activity   Alcohol use: Not Currently   Drug use: Never   Sexual activity: Not on file

## 2022-07-08 DIAGNOSIS — M25571 Pain in right ankle and joints of right foot: Secondary | ICD-10-CM | POA: Diagnosis not present

## 2022-07-25 ENCOUNTER — Ambulatory Visit: Payer: BC Managed Care – PPO | Admitting: Orthopedic Surgery

## 2022-07-28 DIAGNOSIS — M7671 Peroneal tendinitis, right leg: Secondary | ICD-10-CM | POA: Diagnosis not present

## 2022-07-28 DIAGNOSIS — M25571 Pain in right ankle and joints of right foot: Secondary | ICD-10-CM | POA: Diagnosis not present

## 2022-07-28 DIAGNOSIS — S93491A Sprain of other ligament of right ankle, initial encounter: Secondary | ICD-10-CM | POA: Diagnosis not present

## 2022-10-07 DIAGNOSIS — L400 Psoriasis vulgaris: Secondary | ICD-10-CM | POA: Insufficient documentation

## 2022-10-27 ENCOUNTER — Encounter: Payer: Self-pay | Admitting: Radiology

## 2022-11-29 DIAGNOSIS — M79672 Pain in left foot: Secondary | ICD-10-CM | POA: Diagnosis not present

## 2022-11-29 DIAGNOSIS — M7742 Metatarsalgia, left foot: Secondary | ICD-10-CM | POA: Diagnosis not present

## 2022-11-29 DIAGNOSIS — M778 Other enthesopathies, not elsewhere classified: Secondary | ICD-10-CM | POA: Diagnosis not present

## 2022-12-01 DIAGNOSIS — L400 Psoriasis vulgaris: Secondary | ICD-10-CM | POA: Diagnosis not present

## 2023-01-02 DIAGNOSIS — Z6829 Body mass index (BMI) 29.0-29.9, adult: Secondary | ICD-10-CM | POA: Diagnosis not present

## 2023-01-02 DIAGNOSIS — Z13220 Encounter for screening for lipoid disorders: Secondary | ICD-10-CM | POA: Diagnosis not present

## 2023-01-02 DIAGNOSIS — Z1231 Encounter for screening mammogram for malignant neoplasm of breast: Secondary | ICD-10-CM | POA: Diagnosis not present

## 2023-01-02 DIAGNOSIS — Z124 Encounter for screening for malignant neoplasm of cervix: Secondary | ICD-10-CM | POA: Diagnosis not present

## 2023-01-02 DIAGNOSIS — Z01419 Encounter for gynecological examination (general) (routine) without abnormal findings: Secondary | ICD-10-CM | POA: Diagnosis not present

## 2023-01-02 LAB — VITAMIN D 25 HYDROXY (VIT D DEFICIENCY, FRACTURES): Vit D, 25-Hydroxy: 67.1

## 2023-01-02 LAB — BASIC METABOLIC PANEL
Creatinine: 0.9 (ref 0.5–1.1)
Glucose: 104
Sodium: 142 (ref 137–147)

## 2023-01-02 LAB — CBC AND DIFFERENTIAL: WBC: 5.8

## 2023-01-02 LAB — HM MAMMOGRAPHY

## 2023-01-02 LAB — LIPID PANEL
Cholesterol: 152 (ref 0–200)
HDL: 63 (ref 35–70)
LDL Cholesterol: 76
LDl/HDL Ratio: 13
Triglycerides: 63 (ref 40–160)

## 2023-01-02 LAB — COMPREHENSIVE METABOLIC PANEL
Albumin: 4.6 (ref 3.5–5.0)
eGFR: 76

## 2023-01-02 LAB — HEMOGLOBIN A1C: Hemoglobin A1C: 5.7

## 2023-01-02 LAB — TSH: TSH: 2.49 (ref 0.41–5.90)

## 2023-01-04 LAB — HM PAP SMEAR

## 2023-01-05 ENCOUNTER — Ambulatory Visit (INDEPENDENT_AMBULATORY_CARE_PROVIDER_SITE_OTHER): Payer: BC Managed Care – PPO | Admitting: Family Medicine

## 2023-01-05 ENCOUNTER — Encounter: Payer: Self-pay | Admitting: Family Medicine

## 2023-01-05 VITALS — BP 136/88 | HR 70 | Ht 65.0 in | Wt 171.0 lb

## 2023-01-05 DIAGNOSIS — Z1159 Encounter for screening for other viral diseases: Secondary | ICD-10-CM

## 2023-01-05 DIAGNOSIS — E0789 Other specified disorders of thyroid: Secondary | ICD-10-CM

## 2023-01-05 DIAGNOSIS — R7301 Impaired fasting glucose: Secondary | ICD-10-CM

## 2023-01-05 DIAGNOSIS — E7849 Other hyperlipidemia: Secondary | ICD-10-CM

## 2023-01-05 DIAGNOSIS — E663 Overweight: Secondary | ICD-10-CM | POA: Diagnosis not present

## 2023-01-05 DIAGNOSIS — E559 Vitamin D deficiency, unspecified: Secondary | ICD-10-CM | POA: Diagnosis not present

## 2023-01-05 DIAGNOSIS — Z1211 Encounter for screening for malignant neoplasm of colon: Secondary | ICD-10-CM

## 2023-01-05 NOTE — Progress Notes (Signed)
labs

## 2023-01-05 NOTE — Patient Instructions (Addendum)
I appreciate the opportunity to provide care to you today!    Follow up:  3 months  Labs: next visit  To lowe your Hemoglobin A1c:  I recommend avoiding simple carbohydrates, including cakes, sweet desserts, ice cream, soda (diet or regular), sweet tea, candies, chips, cookies, store-bought juices, alcohol in excess of 1-2 drinks a day, lemonade, artificial sweeteners, donuts, coffee creamers, and sugar-free products.  I recommend avoiding greasy, fatty foods with increased physical activity.   Physical activity helps: Lower your blood glucose, improve your heart health, lower your blood pressure and cholesterol, burn calories to help manage her weight, gave you energy, lower stress, and improve his sleep.  The American diabetes Association (ADA) recommends being active for 2-1/2 hours (150 minutes) or more week.  Exercise for 30 minutes, 5 days a week (150 minutes total)    Please continue to a heart-healthy diet and increase your physical activities. Try to exercise for at least five days a week.      It was a pleasure to see you and I look forward to continuing to work together on your health and well-being. Please do not hesitate to call the office if you need care or have questions about your care.   Have a wonderful day and week. With Gratitude, Gilmore Laroche MSN, FNP-BC

## 2023-01-05 NOTE — Progress Notes (Signed)
New Patient Office Visit  Subjective:  Patient ID: Patricia Norris, female    DOB: Jul 22, 1976  Age: 47 y.o. MRN: [id number]  CC:  Chief Complaint  Patient presents with   New Patient (Initial Visit)    Establishing care, needs pcp, had been going to obgyn for health needs. Had labs done on 01/02/2023 brough a copy with her to review today, has concerns regarding these labs.     HPI Patricia Norris is a 47 y.o. female with past medical history of solitary pulmonary nodule and IBS presents for establishing care. For the details of today's visit, please refer to the assessment and plan.     Weight: 171 lb 0.6 oz (77.6 kg)    Past Medical History:  Diagnosis Date   Psoriasis     Past Surgical History:  Procedure Laterality Date   TONSILLECTOMY  04/30/2003    Family History  Problem Relation Age of Onset   Colon cancer Maternal Aunt     Social History   Socioeconomic History   Marital status: Married    Spouse name: Not on file   Number of children: Not on file   Years of education: Not on file   Highest education level: Not on file  Occupational History   Not on file  Tobacco Use   Smoking status: Never   Smokeless tobacco: Never  Substance and Sexual Activity   Alcohol use: Not Currently   Drug use: Never   Sexual activity: Not on file  Other Topics Concern   Not on file  Social History Narrative   Not on file   Social Determinants of Health   Financial Resource Strain: Not on file  Food Insecurity: Not on file  Transportation Needs: Not on file  Physical Activity: Not on file  Stress: Not on file  Social Connections: Not on file  Intimate Partner Violence: Not on file    ROS Review of Systems  Constitutional:  Negative for chills and fever.  Eyes:  Negative for visual disturbance.  Respiratory:  Negative for chest tightness and shortness of breath.   Neurological:  Negative for dizziness and headaches.    Objective:   Today's Vitals: BP 136/88  (BP Location: Left Arm)   Pulse 70   Ht 5\' 5"  (1.651 m)   Wt 171 lb 0.6 oz (77.6 kg)   SpO2 94%   BMI 28.46 kg/m   Physical Exam HENT:     Head: Normocephalic.     Mouth/Throat:     Mouth: Mucous membranes are moist.  Cardiovascular:     Rate and Rhythm: Normal rate.     Heart sounds: Normal heart sounds.  Pulmonary:     Effort: Pulmonary effort is normal.     Breath sounds: Normal breath sounds.  Neurological:     Mental Status: She is alert.      Assessment & Plan:   Overweight (BMI 25.0-29.9) Assessment & Plan: The patient reports that she has gained weight over the past few months She admits to minimal physical activity Encouraged heart healthy diet with increased physical activity Her recent labs indicated a hemoglobin A1c of 5.7 I recommend avoiding simple carbohydrates, including cakes, sweet desserts, ice cream, soda (diet or regular), sweet tea, candies, chips, cookies, store-bought juices, alcohol in excess of 1-2 drinks a day, lemonade, artificial sweeteners, donuts, coffee creamers, and sugar-free products.  I recommend avoiding greasy, fatty foods with increased physical activity.    Vitamin D deficiency  Impaired  fasting blood sugar  Other hyperlipidemia  Encounter for hepatitis C screening test for low risk patient  Other specified disorders of thyroid  Colon cancer screening   Note: This chart has been completed using Engineer, civil (consulting) software, and while attempts have been made to ensure accuracy, certain words and phrases may not be transcribed as intended.     Follow-up: Return in about 3 months (around 04/07/2023).   Patricia Laroche, FNP

## 2023-01-05 NOTE — Assessment & Plan Note (Signed)
The patient reports that she has gained weight over the past few months She admits to minimal physical activity Encouraged heart healthy diet with increased physical activity Her recent labs indicated a hemoglobin A1c of 5.7 I recommend avoiding simple carbohydrates, including cakes, sweet desserts, ice cream, soda (diet or regular), sweet tea, candies, chips, cookies, store-bought juices, alcohol in excess of 1-2 drinks a day, lemonade, artificial sweeteners, donuts, coffee creamers, and sugar-free products.  I recommend avoiding greasy, fatty foods with increased physical activity.

## 2023-01-05 NOTE — Progress Notes (Signed)
Mammogram hx

## 2023-01-30 ENCOUNTER — Other Ambulatory Visit (HOSPITAL_BASED_OUTPATIENT_CLINIC_OR_DEPARTMENT_OTHER): Payer: Self-pay

## 2023-01-30 DIAGNOSIS — G4700 Insomnia, unspecified: Secondary | ICD-10-CM

## 2023-03-30 DIAGNOSIS — N760 Acute vaginitis: Secondary | ICD-10-CM | POA: Diagnosis not present

## 2023-03-30 DIAGNOSIS — J029 Acute pharyngitis, unspecified: Secondary | ICD-10-CM | POA: Diagnosis not present

## 2023-03-30 DIAGNOSIS — J019 Acute sinusitis, unspecified: Secondary | ICD-10-CM | POA: Diagnosis not present

## 2023-03-30 DIAGNOSIS — R0981 Nasal congestion: Secondary | ICD-10-CM | POA: Diagnosis not present

## 2023-04-10 ENCOUNTER — Ambulatory Visit (INDEPENDENT_AMBULATORY_CARE_PROVIDER_SITE_OTHER): Payer: BC Managed Care – PPO | Admitting: Family Medicine

## 2023-04-10 ENCOUNTER — Encounter: Payer: Self-pay | Admitting: Family Medicine

## 2023-04-10 VITALS — BP 137/74 | HR 75 | Ht 65.0 in | Wt 156.1 lb

## 2023-04-10 DIAGNOSIS — E038 Other specified hypothyroidism: Secondary | ICD-10-CM

## 2023-04-10 DIAGNOSIS — R7301 Impaired fasting glucose: Secondary | ICD-10-CM | POA: Diagnosis not present

## 2023-04-10 DIAGNOSIS — J018 Other acute sinusitis: Secondary | ICD-10-CM

## 2023-04-10 DIAGNOSIS — Z1159 Encounter for screening for other viral diseases: Secondary | ICD-10-CM

## 2023-04-10 DIAGNOSIS — Z114 Encounter for screening for human immunodeficiency virus [HIV]: Secondary | ICD-10-CM

## 2023-04-10 DIAGNOSIS — J329 Chronic sinusitis, unspecified: Secondary | ICD-10-CM | POA: Insufficient documentation

## 2023-04-10 DIAGNOSIS — E7849 Other hyperlipidemia: Secondary | ICD-10-CM

## 2023-04-10 DIAGNOSIS — E559 Vitamin D deficiency, unspecified: Secondary | ICD-10-CM | POA: Diagnosis not present

## 2023-04-10 DIAGNOSIS — G4700 Insomnia, unspecified: Secondary | ICD-10-CM

## 2023-04-10 MED ORDER — TRAZODONE HCL 50 MG PO TABS
50.0000 mg | ORAL_TABLET | Freq: Every day | ORAL | 0 refills | Status: DC
Start: 2023-04-10 — End: 2023-10-12

## 2023-04-10 MED ORDER — PREDNISONE 20 MG PO TABS
40.0000 mg | ORAL_TABLET | Freq: Every day | ORAL | 0 refills | Status: AC
Start: 2023-04-10 — End: 2023-04-15

## 2023-04-10 NOTE — Progress Notes (Signed)
Established Patient Office Visit  Subjective:  Patient ID: Patricia Norris, female    DOB: March 14, 1976  Age: 47 y.o. MRN: [id number]  CC:  Chief Complaint  Patient presents with   Care Management    3 month f/u   Nasal Congestion    Pt reports having a sinus infection last week still feeling left ear pain and left side neck pain.    Medication Refill    Pt would like a refill on trazadone needs a 90 day supply    HPI Patricia Norris is a 47 y.o. female with past medical history of insomnia and IBS presents for f/u of  chronic medical conditions. For the details of today's visit, please refer to the assessment and plan.     Past Medical History:  Diagnosis Date   Psoriasis     Past Surgical History:  Procedure Laterality Date   TONSILLECTOMY  04/30/2003    Family History  Problem Relation Age of Onset   Colon cancer Maternal Aunt     Social History   Socioeconomic History   Marital status: Married    Spouse name: Not on file   Number of children: Not on file   Years of education: Not on file   Highest education level: Not on file  Occupational History   Not on file  Tobacco Use   Smoking status: Never   Smokeless tobacco: Never  Substance and Sexual Activity   Alcohol use: Not Currently   Drug use: Never   Sexual activity: Not on file  Other Topics Concern   Not on file  Social History Narrative   Not on file   Social Determinants of Health   Financial Resource Strain: Not on file  Food Insecurity: Not on file  Transportation Needs: Not on file  Physical Activity: Not on file  Stress: Not on file  Social Connections: Not on file  Intimate Partner Violence: Not on file    Outpatient Medications Prior to Visit  Medication Sig Dispense Refill   Apremilast (OTEZLA) 30 MG TABS Take 30 mg by mouth daily.     Fexofenadine HCl (ALLEGRA ALLERGY PO) Take by mouth.     Norethin Ace-Eth Estrad-FE (HAILEY 24 FE PO) Hailey 24 Fe 1 mg-20 mcg (24)/75 mg (4) tablet   TAKE 1 TABLET BY MOUTH EVERY DAY AS DIRECTED     omeprazole (PRILOSEC) 40 MG capsule Take 1 capsule by mouth daily.  11   WELLBUTRIN XL 300 MG 24 hr tablet Take 300 mg by mouth daily.     traZODone (DESYREL) 50 MG tablet Take 50 mg by mouth daily.     No facility-administered medications prior to visit.    No Known Allergies  ROS Review of Systems  Constitutional:  Negative for chills and fever.  HENT:  Positive for ear pain.   Eyes:  Negative for visual disturbance.  Respiratory:  Negative for chest tightness and shortness of breath.   Neurological:  Negative for dizziness and headaches.      Objective:    Physical Exam HENT:     Head: Normocephalic.     Right Ear: No drainage. No middle ear effusion. Tympanic membrane is not erythematous or retracted.     Left Ear: No drainage. Tympanic membrane is not erythematous or retracted.     Mouth/Throat:     Mouth: Mucous membranes are moist.  Cardiovascular:     Rate and Rhythm: Normal rate.     Heart sounds: Normal  heart sounds.  Pulmonary:     Effort: Pulmonary effort is normal.     Breath sounds: Normal breath sounds.  Neurological:     Mental Status: She is alert.     BP 137/74   Pulse 75   Ht 5\' 5"  (1.651 m)   Wt 156 lb 1.9 oz (70.8 kg)   SpO2 97%   BMI 25.98 kg/m  Wt Readings from Last 3 Encounters:  04/10/23 156 lb 1.9 oz (70.8 kg)  01/05/23 171 lb 0.6 oz (77.6 kg)  07/04/22 155 lb (70.3 kg)    Lab Results  Component Value Date   TSH 2.49 01/02/2023   Lab Results  Component Value Date   WBC 5.8 01/02/2023   HGB 13.0 05/25/2022   HCT 39.1 05/25/2022   MCV 92.4 05/25/2022   PLT 320 05/25/2022   Lab Results  Component Value Date   NA 142 01/02/2023   K 3.7 05/25/2022   CO2 23 05/25/2022   GLUCOSE 105 (H) 05/25/2022   BUN 9 05/25/2022   CREATININE 0.9 01/02/2023   BILITOT 0.6 05/25/2022   ALKPHOS 44 05/25/2022   AST 19 05/25/2022   ALT 21 05/25/2022   PROT 7.3 05/25/2022   ALBUMIN 4.6  01/02/2023   CALCIUM 8.9 05/25/2022   ANIONGAP 11 05/25/2022   EGFR 76 01/02/2023   Lab Results  Component Value Date   CHOL 152 01/02/2023   Lab Results  Component Value Date   HDL 63 01/02/2023   Lab Results  Component Value Date   LDLCALC 76 01/02/2023   Lab Results  Component Value Date   TRIG 63 01/02/2023   No results found for: "CHOLHDL" Lab Results  Component Value Date   HGBA1C 5.7 01/02/2023      Assessment & Plan:  Insomnia, unspecified type Assessment & Plan: Refill trazodone 50 mg by mouth to take at bedtime   Orders: -     traZODone HCl; Take 1 tablet (50 mg total) by mouth daily.  Dispense: 90 tablet; Refill: 0  Other subacute sinusitis Assessment & Plan: Reports being treated for bacterial sinusitis with a 10-day treatment of Augmentin which she completed last week Reports relief in her symptoms however she does note some left ear pain and tenderness of the left superficial cervical lymph nodes No systematic symptoms reported We will treat today with prednisone 40 mg daily for 5 days Encouraged supportive care with rest, hydration and to take over-the-counter analgesic as needed for pain  Orders: -     predniSONE; Take 2 tablets (40 mg total) by mouth daily with breakfast for 5 days.  Dispense: 10 tablet; Refill: 0  Need for hepatitis C screening test -     Hepatitis C antibody  Encounter for screening for HIV -     HIV Antibody (routine testing w rflx)  IFG (impaired fasting glucose) -     Hemoglobin A1c  Vitamin D deficiency -     VITAMIN D 25 Hydroxy (Vit-D Deficiency, Fractures)  Other specified hypothyroidism -     TSH + free T4  Other hyperlipidemia -     Lipid panel -     CMP14+EGFR -     CBC with Differential/Platelet  Note: This chart has been completed using Engineer, civil (consulting) software, and while attempts have been made to ensure accuracy, certain words and phrases may not be transcribed as intended.    Follow-up:  Return in about 3 months (around 07/11/2023).   Gilmore Laroche, FNP

## 2023-04-10 NOTE — Assessment & Plan Note (Addendum)
Refill trazodone 50 mg by mouth to take at bedtime

## 2023-04-10 NOTE — Assessment & Plan Note (Signed)
Reports being treated for bacterial sinusitis with a 10-day treatment of Augmentin which she completed last week Reports relief in her symptoms however she does note some left ear pain and tenderness of the left superficial cervical lymph nodes No systematic symptoms reported We will treat today with prednisone 40 mg daily for 5 days Encouraged supportive care with rest, hydration and to take over-the-counter analgesic as needed for pain

## 2023-04-10 NOTE — Patient Instructions (Addendum)
I appreciate the opportunity to provide care to you today!    Follow up:  3 months  Labs: please stop by the lab today to get your blood drawn (CBC, CMP, TSH, Lipid profile, HgA1c, Vit D)  Screening: HIV and Hep C   Attached with your AVS, you will find valuable resources for self-education. I highly recommend dedicating some time to thoroughly examine them.   Please continue to a heart-healthy diet and increase your physical activities. Try to exercise for at least five days a week.    It was a pleasure to see you and I look forward to continuing to work together on your health and well-being. Please do not hesitate to call the office if you need care or have questions about your care.  In case of emergency, please visit the Emergency Department for urgent care, or contact our clinic at [phone] to schedule an appointment. We're here to help you!   Have a wonderful day and week. With Gratitude, Gilmore Laroche MSN, FNP-BC

## 2023-07-05 ENCOUNTER — Ambulatory Visit: Payer: BC Managed Care – PPO | Admitting: Family Medicine

## 2023-07-06 ENCOUNTER — Telehealth: Payer: BC Managed Care – PPO | Admitting: Physician Assistant

## 2023-07-06 DIAGNOSIS — J019 Acute sinusitis, unspecified: Secondary | ICD-10-CM | POA: Diagnosis not present

## 2023-07-06 MED ORDER — AMOXICILLIN-POT CLAVULANATE 875-125 MG PO TABS: 1.0000 | ORAL_TABLET | Freq: Two times a day (BID) | ORAL | 0 refills | Status: AC

## 2023-07-06 NOTE — Progress Notes (Signed)

## 2023-08-07 ENCOUNTER — Ambulatory Visit: Payer: BC Managed Care – PPO | Admitting: Family Medicine

## 2023-08-09 ENCOUNTER — Ambulatory Visit (INDEPENDENT_AMBULATORY_CARE_PROVIDER_SITE_OTHER): Payer: BC Managed Care – PPO | Admitting: Family Medicine

## 2023-08-09 ENCOUNTER — Encounter: Payer: Self-pay | Admitting: Family Medicine

## 2023-08-09 ENCOUNTER — Ambulatory Visit: Payer: Self-pay | Admitting: Family Medicine

## 2023-08-09 VITALS — BP 124/75 | HR 76 | Ht 65.0 in | Wt 161.0 lb

## 2023-08-09 DIAGNOSIS — E559 Vitamin D deficiency, unspecified: Secondary | ICD-10-CM

## 2023-08-09 DIAGNOSIS — R7301 Impaired fasting glucose: Secondary | ICD-10-CM

## 2023-08-09 DIAGNOSIS — E663 Overweight: Secondary | ICD-10-CM

## 2023-08-09 DIAGNOSIS — E038 Other specified hypothyroidism: Secondary | ICD-10-CM

## 2023-08-09 DIAGNOSIS — N951 Menopausal and female climacteric states: Secondary | ICD-10-CM | POA: Insufficient documentation

## 2023-08-09 DIAGNOSIS — E7849 Other hyperlipidemia: Secondary | ICD-10-CM

## 2023-08-09 MED ORDER — PHENTERMINE HCL 15 MG PO CAPS: 15.0000 mg | ORAL_CAPSULE | ORAL | 0 refills | Status: DC

## 2023-08-09 MED ORDER — VEOZAH 45 MG PO TABS
45.0000 mg | ORAL_TABLET | Freq: Every day | ORAL | 1 refills | Status: DC
Start: 2023-08-09 — End: 2023-09-04

## 2023-08-09 NOTE — Patient Instructions (Addendum)
I appreciate the opportunity to provide care to you today!    Follow up:  1 months  Labs: please stop by the lab today to get your blood drawn (CBC, CMP, TSH, Lipid profile, HgA1c, Vit D)    Start taking phentermine 15 mg daily for weight loss. I recommend implementing lifestyle changes with a heart-healthy diet and increased physical activity while on medication for optimal results. A prescription for Veozah 45 mg to take daily has been sent to your pharmacy to help with vasomotor symptoms of menopause, such as hot flashes and night sweats. Common side effects of Veozah include stomach or abdominal pain, diarrhea, and back pain. Please follow up with me if you experience symptoms such as yellowing of your skin or eyes, or pain in the right upper quadrant with severe nausea and vomiting while on the treatment regimen. These may be signs of liver problems while on Veozah.   Attached with your AVS, you will find valuable resources for self-education. I highly recommend dedicating some time to thoroughly examine them.   Please continue to a heart-healthy diet and increase your physical activities. Try to exercise for at least five days a week.    It was a pleasure to see you and I look forward to continuing to work together on your health and well-being. Please do not hesitate to call the office if you need care or have questions about your care.  In case of emergency, please visit the Emergency Department for urgent care, or contact our clinic at [phone] to schedule an appointment. We're here to help you!   Have a wonderful day and week. With Gratitude, Gilmore Laroche MSN, FNP-BC

## 2023-08-09 NOTE — Assessment & Plan Note (Signed)
The patient complains of vasomotor symptoms, including hot flashes occurring every 20 minutes, since stopping her menses 6 months ago. She has tried over-the-counter supplements with minimal relief and is requesting pharmacological treatment today. The patient's liver enzymes are stable, and we will initiate therapy with Veozah 45 mg daily to address the vasomotor symptoms of premenopause. Common side effects of Veozah, such as nausea, vomiting, and back pain, were reviewed with the patient. She was encouraged to follow up if she experiences symptoms of jaundice or severe right upper quadrant pain. The patient verbalized understanding of the plan of care and will follow up in one month to assess treatment effectiveness.

## 2023-08-09 NOTE — Assessment & Plan Note (Signed)
The patient reports difficulty losing weight since transitioning to premenopausal status. She has been implementing lifestyle changes, including a heart-healthy diet and increased physical activity, but has seen minimal changes in her weight. Her ideal weight has always been 130 pounds. We discussed various antidiabetic medications, and the patient has expressed interest in starting oral phentermine today. The common side effects of phentermine, including palpitations, elevated blood pressure, insomnia, dry mouth, and arrhythmia, were reviewed with the patient. She was encouraged to report any of these symptoms during the course of treatment. The patient does not have any cardiovascular history. The treatment plan is to start phentermine 15 mg daily, with the dose to be titrated monthly. A follow-up visit is scheduled monthly to assess the effectiveness of the treatment. The patient was reminded of the importance of continuing a heart-healthy diet and increasing physical activity while on this treatment regimen. The patient verbalized understanding and is aware of the plan of care. Wt Readings from Last 3 Encounters:  08/09/23 161 lb (73 kg)  04/10/23 156 lb 1.9 oz (70.8 kg)  01/05/23 171 lb 0.6 oz (77.6 kg)

## 2023-08-09 NOTE — Progress Notes (Signed)
Established Patient Office Visit  Subjective:  Patient ID: Patricia Norris, female    DOB: [birth year]  Age: 47 y.o. MRN: [id number]  CC:  Chief Complaint  Patient presents with   Care Management    4 month f/u    HPI Patricia Norris is a 47 y.o. female with past medical history of IBS and overweight presents for f/u of  chronic medical conditions. For the details of today's visit, please refer to the assessment and plan.     Past Medical History:  Diagnosis Date   Psoriasis     Past Surgical History:  Procedure Laterality Date   TONSILLECTOMY  04/30/2003    Family History  Problem Relation Age of Onset   Colon cancer Maternal Aunt     Social History   Socioeconomic History   Marital status: Married    Spouse name: Not on file   Number of children: Not on file   Years of education: Not on file   Highest education level: Not on file  Occupational History   Not on file  Tobacco Use   Smoking status: Never   Smokeless tobacco: Never  Substance and Sexual Activity   Alcohol use: Not Currently   Drug use: Never   Sexual activity: Not on file  Other Topics Concern   Not on file  Social History Narrative   Not on file   Social Determinants of Health   Financial Resource Strain: Not on file  Food Insecurity: Not on file  Transportation Needs: Not on file  Physical Activity: Not on file  Stress: Not on file  Social Connections: Not on file  Intimate Partner Violence: Not on file    Outpatient Medications Prior to Visit  Medication Sig Dispense Refill   Apremilast (OTEZLA) 30 MG TABS Take 30 mg by mouth daily.     Fexofenadine HCl (ALLEGRA ALLERGY PO) Take by mouth.     omeprazole (PRILOSEC) 40 MG capsule Take 1 capsule by mouth daily.  11   traZODone (DESYREL) 50 MG tablet Take 1 tablet (50 mg total) by mouth daily. 90 tablet 0   WELLBUTRIN XL 300 MG 24 hr tablet Take 300 mg by mouth daily.     Norethin Ace-Eth Estrad-FE (HAILEY 24 FE PO) Hailey 24 Fe 1 mg-20  mcg (24)/75 mg (4) tablet  TAKE 1 TABLET BY MOUTH EVERY DAY AS DIRECTED     No facility-administered medications prior to visit.    No Known Allergies  ROS Review of Systems  Constitutional:  Negative for chills and fever.  Eyes:  Negative for visual disturbance.  Respiratory:  Negative for chest tightness and shortness of breath.   Neurological:  Negative for dizziness and headaches.      Objective:    Physical Exam HENT:     Head: Normocephalic.     Mouth/Throat:     Mouth: Mucous membranes are moist.  Cardiovascular:     Rate and Rhythm: Normal rate.     Heart sounds: Normal heart sounds.  Pulmonary:     Effort: Pulmonary effort is normal.     Breath sounds: Normal breath sounds.  Neurological:     Mental Status: She is alert.     BP 124/75   Pulse 76   Ht 5\' 5"  (1.651 m)   Wt 161 lb (73 kg)   SpO2 95%   BMI 26.79 kg/m  Wt Readings from Last 3 Encounters:  08/09/23 161 lb (73 kg)  04/10/23 156 lb  1.9 oz (70.8 kg)  01/05/23 171 lb 0.6 oz (77.6 kg)    Lab Results  Component Value Date   TSH 2.020 04/10/2023   Lab Results  Component Value Date   WBC 5.1 04/10/2023   HGB 13.5 04/10/2023   HCT 41.7 04/10/2023   MCV 92 04/10/2023   PLT 312 04/10/2023   Lab Results  Component Value Date   NA 139 04/10/2023   K 4.4 04/10/2023   CO2 25 04/10/2023   GLUCOSE 100 (H) 04/10/2023   BUN 12 04/10/2023   CREATININE 0.84 04/10/2023   BILITOT 0.3 04/10/2023   ALKPHOS 41 (L) 04/10/2023   AST 18 04/10/2023   ALT 13 04/10/2023   PROT 6.8 04/10/2023   ALBUMIN 4.6 04/10/2023   CALCIUM 9.6 04/10/2023   ANIONGAP 11 05/25/2022   EGFR 86 04/10/2023   Lab Results  Component Value Date   CHOL 126 04/10/2023   Lab Results  Component Value Date   HDL 57 04/10/2023   Lab Results  Component Value Date   LDLCALC 58 04/10/2023   Lab Results  Component Value Date   TRIG 46 04/10/2023   Lab Results  Component Value Date   CHOLHDL 2.2 04/10/2023   Lab  Results  Component Value Date   HGBA1C 5.5 04/10/2023      Assessment & Plan:  Perimenopausal vasomotor symptoms Assessment & Plan: The patient complains of vasomotor symptoms, including hot flashes occurring every 20 minutes, since stopping her menses 6 months ago. She has tried over-the-counter supplements with minimal relief and is requesting pharmacological treatment today. The patient's liver enzymes are stable, and we will initiate therapy with Veozah 45 mg daily to address the vasomotor symptoms of premenopause. Common side effects of Veozah, such as nausea, vomiting, and back pain, were reviewed with the patient. She was encouraged to follow up if she experiences symptoms of jaundice or severe right upper quadrant pain. The patient verbalized understanding of the plan of care and will follow up in one month to assess treatment effectiveness.   Orders: Allyne Gee; Take 1 tablet (45 mg total) by mouth daily.  Dispense: 30 tablet; Refill: 1  Overweight (BMI 25.0-29.9) Assessment & Plan: The patient reports difficulty losing weight since transitioning to premenopausal status. She has been implementing lifestyle changes, including a heart-healthy diet and increased physical activity, but has seen minimal changes in her weight. Her ideal weight has always been 130 pounds. We discussed various antidiabetic medications, and the patient has expressed interest in starting oral phentermine today. The common side effects of phentermine, including palpitations, elevated blood pressure, insomnia, dry mouth, and arrhythmia, were reviewed with the patient. She was encouraged to report any of these symptoms during the course of treatment. The patient does not have any cardiovascular history. The treatment plan is to start phentermine 15 mg daily, with the dose to be titrated monthly. A follow-up visit is scheduled monthly to assess the effectiveness of the treatment. The patient was reminded of the  importance of continuing a heart-healthy diet and increasing physical activity while on this treatment regimen. The patient verbalized understanding and is aware of the plan of care. Wt Readings from Last 3 Encounters:  08/09/23 161 lb (73 kg)  04/10/23 156 lb 1.9 oz (70.8 kg)  01/05/23 171 lb 0.6 oz (77.6 kg)       Orders: -     Phentermine HCl; Take 1 capsule (15 mg total) by mouth every morning.  Dispense: 30  capsule; Refill: 0  IFG (impaired fasting glucose) -     Hemoglobin A1c  Vitamin D deficiency -     VITAMIN D 25 Hydroxy (Vit-D Deficiency, Fractures)  TSH (thyroid-stimulating hormone deficiency) -     TSH + free T4  Other hyperlipidemia -     Lipid panel -     CMP14+EGFR -     CBC with Differential/Platelet  Note: This chart has been completed using Engineer, civil (consulting) software, and while attempts have been made to ensure accuracy, certain words and phrases may not be transcribed as intended.    Follow-up: Return in about 1 month (around 09/09/2023).   Gilmore Laroche, FNP

## 2023-08-10 LAB — CMP14+EGFR
ALT: 36 [IU]/L — ABNORMAL HIGH (ref 0–32)
AST: 24 [IU]/L (ref 0–40)
Albumin: 4.9 g/dL (ref 3.9–4.9)
Alkaline Phosphatase: 61 [IU]/L (ref 44–121)
BUN/Creatinine Ratio: 13 (ref 9–23)
BUN: 13 mg/dL (ref 6–24)
Bilirubin Total: 0.3 mg/dL (ref 0.0–1.2)
CO2: 26 mmol/L (ref 20–29)
Calcium: 10 mg/dL (ref 8.7–10.2)
Chloride: 99 mmol/L (ref 96–106)
Creatinine, Ser: 0.99 mg/dL (ref 0.57–1.00)
Globulin, Total: 2.3 g/dL (ref 1.5–4.5)
Glucose: 105 mg/dL — ABNORMAL HIGH (ref 70–99)
Potassium: 5.2 mmol/L (ref 3.5–5.2)
Sodium: 139 mmol/L (ref 134–144)
Total Protein: 7.2 g/dL (ref 6.0–8.5)
eGFR: 70 mL/min/{1.73_m2} (ref >59)

## 2023-08-10 LAB — CBC WITH DIFFERENTIAL/PLATELET
Basophils Absolute: 0 10*3/uL (ref 0.0–0.2)
Basos: 1 %
EOS (ABSOLUTE): 0.4 10*3/uL (ref 0.0–0.4)
Eos: 8 %
Hematocrit: 42.9 % (ref 34.0–46.6)
Hemoglobin: 13.9 g/dL (ref 11.1–15.9)
Immature Grans (Abs): 0 10*3/uL (ref 0.0–0.1)
Immature Granulocytes: 0 %
Lymphocytes Absolute: 1.5 10*3/uL (ref 0.7–3.1)
Lymphs: 33 %
MCH: 29.9 pg (ref 26.6–33.0)
MCHC: 32.4 g/dL (ref 31.5–35.7)
MCV: 92 fL (ref 79–97)
Monocytes Absolute: 0.5 10*3/uL (ref 0.1–0.9)
Monocytes: 11 %
Neutrophils Absolute: 2.1 10*3/uL (ref 1.4–7.0)
Neutrophils: 47 %
Platelets: 314 10*3/uL (ref 150–450)
RBC: 4.65 x10E6/uL (ref 3.77–5.28)
RDW: 11.3 % — ABNORMAL LOW (ref 11.7–15.4)
WBC: 4.5 10*3/uL (ref 3.4–10.8)

## 2023-08-10 LAB — HEMOGLOBIN A1C
Est. average glucose Bld gHb Est-mCnc: 117 mg/dL
Hgb A1c MFr Bld: 5.7 % — ABNORMAL HIGH (ref 4.8–5.6)

## 2023-08-10 LAB — VITAMIN D 25 HYDROXY (VIT D DEFICIENCY, FRACTURES): Vit D, 25-Hydroxy: 35.2 ng/mL (ref 30.0–100.0)

## 2023-08-10 LAB — LIPID PANEL
Chol/HDL Ratio: 2.4 {ratio} (ref 0.0–4.4)
Cholesterol, Total: 164 mg/dL (ref 100–199)
HDL: 68 mg/dL (ref >39)
LDL Chol Calc (NIH): 86 mg/dL (ref 0–99)
Triglycerides: 50 mg/dL (ref 0–149)
VLDL Cholesterol Cal: 10 mg/dL (ref 5–40)

## 2023-08-10 LAB — TSH+FREE T4
Free T4: 1.19 ng/dL (ref 0.82–1.77)
TSH: 1.93 u[IU]/mL (ref 0.450–4.500)

## 2023-09-04 ENCOUNTER — Encounter: Payer: Self-pay | Admitting: Family Medicine

## 2023-09-04 ENCOUNTER — Ambulatory Visit: Payer: BC Managed Care – PPO | Admitting: Family Medicine

## 2023-09-04 VITALS — BP 129/72 | HR 73 | Ht 65.0 in | Wt 159.1 lb

## 2023-09-04 DIAGNOSIS — N951 Menopausal and female climacteric states: Secondary | ICD-10-CM

## 2023-09-04 DIAGNOSIS — E663 Overweight: Secondary | ICD-10-CM

## 2023-09-04 DIAGNOSIS — J0100 Acute maxillary sinusitis, unspecified: Secondary | ICD-10-CM | POA: Diagnosis not present

## 2023-09-04 MED ORDER — NOREL AD 4-10-325 MG PO TABS: ORAL_TABLET | ORAL | 0 refills | Status: DC

## 2023-09-04 MED ORDER — AZITHROMYCIN 250 MG PO TABS: ORAL_TABLET | ORAL | 0 refills | Status: AC

## 2023-09-04 MED ORDER — PHENTERMINE HCL 30 MG PO CAPS: 30.0000 mg | ORAL_CAPSULE | ORAL | 0 refills | Status: DC

## 2023-09-04 MED ORDER — VEOZAH 45 MG PO TABS: 45.0000 mg | ORAL_TABLET | Freq: Every day | ORAL | 2 refills | Status: DC

## 2023-09-04 NOTE — Assessment & Plan Note (Signed)
 The patient is encouraged to start taking phentermine  30 mg daily. I will emphasize the importance of lifestyle modification, including a low-fat, low-calorie diet and increased physical activity. The patient verbalized understanding of the plan of care. A follow-up will be scheduled in one month to assess treatment effectiveness. Wt Readings from Last 3 Encounters:  09/04/23 159 lb 1.9 oz (72.2 kg)  08/09/23 161 lb (73 kg)  04/10/23 156 lb 1.9 oz (70.8 kg)

## 2023-09-04 NOTE — Assessment & Plan Note (Signed)
 No adverse effects reported Refilled Veozah 45 mg daily

## 2023-09-04 NOTE — Assessment & Plan Note (Signed)
 We will initiate therapy with azithromycin  and encourage the patient to complete the full course of antibiotics.  Noral AD has been prescribed to address nasal congestion, runny nose, sneezing, sinus headache, and fever. The patient is advised not to take more than six tablets in 24 hours and to take one tablet every four hours as needed.  The patient is encouraged to continue using Flonase nasal spray for nasal congestion and to follow up if symptoms worsen or do not improve.

## 2023-09-04 NOTE — Patient Instructions (Addendum)
 I appreciate the opportunity to provide care to you today!    Follow up:  1 month  Sinusitis A prescription for Norel AD to help with sinus and nasal congestion, sinus headache, and fever has been sent to your pharmacy. You may take one tablet every four hours while symptoms persist, with a maximum of 6 tablets in 24 hours. Continue using Flonase nasal spray or nasal saline to help with nasal congestion. Azithromycin  has also been prescribed for anaerobic coverage and has been sent to your pharmacy. Please follow up if your symptoms worsen or if there is no improvement with the current treatment regimen.     Please continue to a heart-healthy diet and increase your physical activities. Try to exercise for at least five days a week.    It was a pleasure to see you and I look forward to continuing to work together on your health and well-being. Please do not hesitate to call the office if you need care or have questions about your care.  In case of emergency, please visit the Emergency Department for urgent care, or contact our clinic at [phone] to schedule an appointment. We're here to help you!   Have a wonderful day and week. With Gratitude, Caley Volkert MSN, FNP-BC

## 2023-09-04 NOTE — Progress Notes (Signed)
 Acute Office Visit  Subjective:    Patient ID: Patricia Norris, female    DOB: 09/06/1975, 48 y.o.   MRN: [id number]  Chief Complaint  Patient presents with   Care Management    1 month f/u   Sinus Problem    Pt reports sx of sinus infection still ongoing , facial pressure.     HPI The patient is in today for facial pain and pressure, nasal congestion, postnasal drip, and a nonproductive cough for about two weeks. The patient reports that prior to this, she had a sinus infection and her symptoms have not improved since. She denies fever and reports no headaches. No nausea, vomiting, or diarrhea is noted.  Obesity: The patient has lost 2 pounds while taking phentermine  50 mg daily and reports treatment compliance. No adverse effects have been reported.  Past Medical History:  Diagnosis Date   Psoriasis     Past Surgical History:  Procedure Laterality Date   TONSILLECTOMY  04/30/2003    Family History  Problem Relation Age of Onset   Colon cancer Maternal Aunt     Social History   Socioeconomic History   Marital status: Married    Spouse name: Not on file   Number of children: Not on file   Years of education: Not on file   Highest education level: Not on file  Occupational History   Not on file  Tobacco Use   Smoking status: Never   Smokeless tobacco: Never  Substance and Sexual Activity   Alcohol use: Not Currently   Drug use: Never   Sexual activity: Not on file  Other Topics Concern   Not on file  Social History Narrative   Not on file   Social Drivers of Health   Financial Resource Strain: Not on file  Food Insecurity: Not on file  Transportation Needs: Not on file  Physical Activity: Not on file  Stress: Not on file  Social Connections: Not on file  Intimate Partner Violence: Not on file    Outpatient Medications Prior to Visit  Medication Sig Dispense Refill   Apremilast (OTEZLA) 30 MG TABS Take 30 mg by mouth daily.     Fexofenadine HCl  (ALLEGRA ALLERGY PO) Take by mouth.     omeprazole (PRILOSEC) 40 MG capsule Take 1 capsule by mouth daily.  11   traZODone  (DESYREL ) 50 MG tablet Take 1 tablet (50 mg total) by mouth daily. 90 tablet 0   WELLBUTRIN XL 300 MG 24 hr tablet Take 300 mg by mouth daily.     Fezolinetant  (VEOZAH ) 45 MG TABS Take 1 tablet (45 mg total) by mouth daily. 30 tablet 1   phentermine  15 MG capsule Take 1 capsule (15 mg total) by mouth every morning. 30 capsule 0   No facility-administered medications prior to visit.    No Known Allergies  Review of Systems  Constitutional:  Negative for chills and fever.  HENT:  Positive for congestion, sinus pressure and sinus pain.   Eyes:  Negative for visual disturbance.  Respiratory:  Negative for chest tightness and shortness of breath.   Neurological:  Negative for dizziness and headaches.       Objective:    Physical Exam HENT:     Head: Normocephalic.     Nose:     Right Sinus: Maxillary sinus tenderness and frontal sinus tenderness present.     Left Sinus: Maxillary sinus tenderness and frontal sinus tenderness present.     Mouth/Throat:  Mouth: Mucous membranes are moist.  Cardiovascular:     Rate and Rhythm: Normal rate.     Heart sounds: Normal heart sounds.  Pulmonary:     Effort: Pulmonary effort is normal.     Breath sounds: Normal breath sounds.  Neurological:     Mental Status: She is alert.     BP 129/72   Pulse 73   Ht 5' 5 (1.651 m)   Wt 159 lb 1.9 oz (72.2 kg)   SpO2 97%   BMI 26.48 kg/m  Wt Readings from Last 3 Encounters:  09/04/23 159 lb 1.9 oz (72.2 kg)  08/09/23 161 lb (73 kg)  04/10/23 156 lb 1.9 oz (70.8 kg)       Assessment & Plan:  Subacute maxillary sinusitis Assessment & Plan: We will initiate therapy with azithromycin  and encourage the patient to complete the full course of antibiotics.  Noral AD has been prescribed to address nasal congestion, runny nose, sneezing, sinus headache, and fever. The  patient is advised not to take more than six tablets in 24 hours and to take one tablet every four hours as needed.  The patient is encouraged to continue using Flonase nasal spray for nasal congestion and to follow up if symptoms worsen or do not improve.   Orders: -     Azithromycin ; Take 2 tablets on day 1, then 1 tablet daily on days 2 through 5  Dispense: 6 tablet; Refill: 0 -     Norel AD; Take 1 tablet every 4 hours while symptoms persists. Do not take more than 6 tablets in 24 hours.  Dispense: 20 tablet; Refill: 0  Overweight (BMI 25.0-29.9) Assessment & Plan: The patient is encouraged to start taking phentermine  30 mg daily. I will emphasize the importance of lifestyle modification, including a low-fat, low-calorie diet and increased physical activity. The patient verbalized understanding of the plan of care. A follow-up will be scheduled in one month to assess treatment effectiveness. Wt Readings from Last 3 Encounters:  09/04/23 159 lb 1.9 oz (72.2 kg)  08/09/23 161 lb (73 kg)  04/10/23 156 lb 1.9 oz (70.8 kg)     Orders: -     Phentermine  HCl; Take 1 capsule (30 mg total) by mouth every morning.  Dispense: 30 capsule; Refill: 0  Perimenopausal vasomotor symptoms Assessment & Plan: No adverse effects reported Refilled Veozah  45 mg daily  Orders: -     Veozah ; Take 1 tablet (45 mg total) by mouth daily.  Dispense: 30 tablet; Refill: 2  Note: This chart has been completed using Engineer, [employer] (consulting) software, and while attempts have been made to ensure accuracy, certain words and phrases may not be transcribed as intended.    Trevonte Ashkar, FNP

## 2023-09-05 ENCOUNTER — Other Ambulatory Visit: Payer: Self-pay

## 2023-09-05 ENCOUNTER — Encounter: Payer: Self-pay | Admitting: Family Medicine

## 2023-09-05 DIAGNOSIS — N951 Menopausal and female climacteric states: Secondary | ICD-10-CM

## 2023-09-05 MED ORDER — VEOZAH 45 MG PO TABS: 45.0000 mg | ORAL_TABLET | Freq: Every day | ORAL | 2 refills | Status: AC

## 2023-09-05 NOTE — Telephone Encounter (Signed)
 Thank you :)

## 2023-09-11 NOTE — Telephone Encounter (Unsigned)
 Copied from CRM [id number]. Topic: Clinical - Prescription Issue >> Sep 11, 2023 12:07 PM Patricia Norris wrote: Reason for CRM: Need the refill fot the  Fezolinetant  (VEOZAH ) 45 MG TABS , [facility] [address] [facility] and sent [facility] to [facility] pharmacy [address] [place] phone: # [phone] A message from Patricia Norris was sent over to Patricia Norris regarding the refill

## 2023-10-04 ENCOUNTER — Telehealth: Payer: Self-pay

## 2023-10-06 ENCOUNTER — Ambulatory Visit: Payer: BC Managed Care – PPO | Admitting: Family Medicine

## 2023-10-06 ENCOUNTER — Encounter: Payer: Self-pay | Admitting: Family Medicine

## 2023-10-06 VITALS — BP 114/71 | HR 74 | Ht 65.0 in | Wt 153.0 lb

## 2023-10-06 DIAGNOSIS — E663 Overweight: Secondary | ICD-10-CM | POA: Diagnosis not present

## 2023-10-06 MED ORDER — PHENTERMINE HCL 37.5 MG PO CAPS: 37.5000 mg | ORAL_CAPSULE | ORAL | 0 refills | Status: DC

## 2023-10-06 NOTE — Assessment & Plan Note (Signed)
 The patient reports doing well on phentermine , noting improved focus, concentration, and mental clarity, and inquired whether this could be due to the medication. I informed the patient that some users report enhanced focus and concentration, likely due to phentermine 's stimulant effects on the central nervous system. I also explained that phentermine  has structural similarities to amphetamines.The patient has lost 6 pounds since the last visit and denies side effects such as hypertension, palpitations, dry mouth, or insomnia. She reports eating and sleeping well.  The dose of phentermine  will be increased to 37.5 mg, and the patient is encouraged to follow up in one month to assess treatment effectiveness and monitor weight. I will emphasize the importance of lifestyle modifications, including a low-fat, low-calorie diet and increased physical activity. The patient verbalized understanding of the plan of care.  Wt Readings from Last 3 Encounters:  10/06/23 153 lb 0.6 oz (69.4 kg)  09/04/23 159 lb 1.9 oz (72.2 kg)  08/09/23 161 lb (73 kg)

## 2023-10-06 NOTE — Progress Notes (Signed)
 Established Patient Office Visit  Subjective:  Patient ID: Patricia Norris, female    DOB: 1976/07/17  Age: 48 y.o. MRN: [id number]  CC:  Chief Complaint  Patient presents with   Follow-up    1 mo follow up     HPI Patricia Norris is a 48 y.o. female  for weight f/u. For the details of today's visit, please refer to the assessment and plan. For the details of today's visit, please refer to the assessment and plan.    Past Medical History:  Diagnosis Date   Psoriasis     Past Surgical History:  Procedure Laterality Date   TONSILLECTOMY  04/30/2003    Family History  Problem Relation Age of Onset   Colon cancer Maternal Aunt     Social History   Socioeconomic History   Marital status: Married    Spouse name: Not on file   Number of children: Not on file   Years of education: Not on file   Highest education level: Not on file  Occupational History   Not on file  Tobacco Use   Smoking status: Never   Smokeless tobacco: Never  Substance and Sexual Activity   Alcohol use: Not Currently   Drug use: Never   Sexual activity: Not on file  Other Topics Concern   Not on file  Social History Narrative   Not on file   Social Drivers of Health   Financial Resource Strain: Not on file  Food Insecurity: Not on file  Transportation Needs: Not on file  Physical Activity: Not on file  Stress: Not on file  Social Connections: Not on file  Intimate Partner Violence: Not on file    Outpatient Medications Prior to Visit  Medication Sig Dispense Refill   Apremilast (OTEZLA) 30 MG TABS Take 30 mg by mouth daily.     Chlorphen-PE-Acetaminophen (NOREL AD) 4-10-325 MG TABS Take 1 tablet every 4 hours while symptoms persists. Do not take more than 6 tablets in 24 hours. 20 tablet 0   Fexofenadine HCl (ALLEGRA ALLERGY PO) Take by mouth.     Fezolinetant  (VEOZAH ) 45 MG TABS Take 1 tablet (45 mg total) by mouth daily. 30 tablet 2   omeprazole (PRILOSEC) 40 MG capsule Take 1 capsule  by mouth daily.  11   traZODone  (DESYREL ) 50 MG tablet Take 1 tablet (50 mg total) by mouth daily. 90 tablet 0   WELLBUTRIN XL 300 MG 24 hr tablet Take 300 mg by mouth daily.     phentermine  30 MG capsule Take 1 capsule (30 mg total) by mouth every morning. 30 capsule 0   No facility-administered medications prior to visit.    No Known Allergies  ROS Review of Systems  Constitutional:  Negative for chills and fever.  Eyes:  Negative for visual disturbance.  Respiratory:  Negative for chest tightness and shortness of breath.   Neurological:  Negative for dizziness and headaches.      Objective:    Physical Exam HENT:     Head: Normocephalic.     Mouth/Throat:     Mouth: Mucous membranes are moist.  Cardiovascular:     Rate and Rhythm: Normal rate.     Heart sounds: Normal heart sounds.  Pulmonary:     Effort: Pulmonary effort is normal.     Breath sounds: Normal breath sounds.  Neurological:     Mental Status: She is alert.     BP 114/71   Pulse 74  Ht 5' 5 (1.651 m)   Wt 153 lb 0.6 oz (69.4 kg)   LMP 08/27/2020   SpO2 96%   BMI 25.47 kg/m  Wt Readings from Last 3 Encounters:  10/06/23 153 lb 0.6 oz (69.4 kg)  09/04/23 159 lb 1.9 oz (72.2 kg)  08/09/23 161 lb (73 kg)    Lab Results  Component Value Date   TSH 1.930 08/09/2023   Lab Results  Component Value Date   WBC 4.5 08/09/2023   HGB 13.9 08/09/2023   HCT 42.9 08/09/2023   MCV 92 08/09/2023   PLT 314 08/09/2023   Lab Results  Component Value Date   NA 139 08/09/2023   K 5.2 08/09/2023   CO2 26 08/09/2023   GLUCOSE 105 (H) 08/09/2023   BUN 13 08/09/2023   CREATININE 0.99 08/09/2023   BILITOT 0.3 08/09/2023   ALKPHOS 61 08/09/2023   AST 24 08/09/2023   ALT 36 (H) 08/09/2023   PROT 7.2 08/09/2023   ALBUMIN 4.9 08/09/2023   CALCIUM 10.0 08/09/2023   ANIONGAP 11 05/25/2022   EGFR 70 08/09/2023   Lab Results  Component Value Date   CHOL 164 08/09/2023   Lab Results  Component Value  Date   HDL 68 08/09/2023   Lab Results  Component Value Date   LDLCALC 86 08/09/2023   Lab Results  Component Value Date   TRIG 50 08/09/2023   Lab Results  Component Value Date   CHOLHDL 2.4 08/09/2023   Lab Results  Component Value Date   HGBA1C 5.7 (H) 08/09/2023      Assessment & Plan:  Overweight (BMI 25.0-29.9) Assessment & Plan: The patient reports doing well on phentermine , noting improved focus, concentration, and mental clarity, and inquired whether this could be due to the medication. I informed the patient that some users report enhanced focus and concentration, likely due to phentermine 's stimulant effects on the central nervous system. I also explained that phentermine  has structural similarities to amphetamines.The patient has lost 6 pounds since the last visit and denies side effects such as hypertension, palpitations, dry mouth, or insomnia. She reports eating and sleeping well.  The dose of phentermine  will be increased to 37.5 mg, and the patient is encouraged to follow up in one month to assess treatment effectiveness and monitor weight. I will emphasize the importance of lifestyle modifications, including a low-fat, low-calorie diet and increased physical activity. The patient verbalized understanding of the plan of care.  Wt Readings from Last 3 Encounters:  10/06/23 153 lb 0.6 oz (69.4 kg)  09/04/23 159 lb 1.9 oz (72.2 kg)  08/09/23 161 lb (73 kg)     Orders: -     Phentermine  HCl; Take 1 capsule (37.5 mg total) by mouth every morning.  Dispense: 30 capsule; Refill: 0  Note: This chart has been completed using Engineer, [employer] (consulting) software, and while attempts have been made to ensure accuracy, certain words and phrases may not be transcribed as intended.    Follow-up: Return in about 1 month (around 11/03/2023).   Bhavana Kady, FNP

## 2023-10-06 NOTE — Patient Instructions (Signed)
 I appreciate the opportunity to provide care to you today!    Follow up:  1 month  Start taking phentermine  37.5 mg daily. For optimal results with weight loss, I recommend:  Decreasing portion sizes. Reducing sugar, sodium, and carbohydrate intake, and limiting saturated fats in your diet. Increasing your fiber intake by incorporating more whole grains, fruits, and vegetables. Setting healthy goals and focusing on lowering carbs, sugar, and fat. Increasing the variety of fruits and vegetables in your diet. Reducing soda consumption and limiting processed foods. In addition to taking your weight loss medication, engage in moderate-intensity physical activity for at least 150 minutes per week for the best results.    Please continue to a heart-healthy diet and increase your physical activities. Try to exercise for at least five days a week.    It was a pleasure to see you and I look forward to continuing to work together on your health and well-being. Please do not hesitate to call the office if you need care or have questions about your care.  In case of emergency, please visit the Emergency Department for urgent care, or contact our clinic at [phone] to schedule an appointment. We're here to help you!   Have a wonderful day and week. With Gratitude, Brolin Dambrosia MSN, FNP-BC'

## 2023-10-09 NOTE — Telephone Encounter (Signed)
 Pt informed by phone. [facility]. She will check with them as well as insurance.

## 2023-10-12 ENCOUNTER — Other Ambulatory Visit: Payer: Self-pay | Admitting: Family Medicine

## 2023-10-12 DIAGNOSIS — G4700 Insomnia, unspecified: Secondary | ICD-10-CM

## 2023-10-15 MED ORDER — TRAZODONE HCL 50 MG PO TABS: 50.0000 mg | ORAL_TABLET | Freq: Every day | ORAL | 0 refills | Status: DC

## 2023-10-24 DIAGNOSIS — K219 Gastro-esophageal reflux disease without esophagitis: Secondary | ICD-10-CM | POA: Diagnosis not present

## 2023-10-24 DIAGNOSIS — Z1211 Encounter for screening for malignant neoplasm of colon: Secondary | ICD-10-CM | POA: Diagnosis not present

## 2023-10-24 DIAGNOSIS — F32A Depression, unspecified: Secondary | ICD-10-CM | POA: Diagnosis not present

## 2023-11-01 ENCOUNTER — Other Ambulatory Visit: Payer: Self-pay | Admitting: Family Medicine

## 2023-11-01 DIAGNOSIS — E663 Overweight: Secondary | ICD-10-CM

## 2023-11-02 MED ORDER — PHENTERMINE HCL 37.5 MG PO CAPS: 37.5000 mg | ORAL_CAPSULE | ORAL | 0 refills | Status: DC

## 2023-11-16 ENCOUNTER — Ambulatory Visit: Payer: BC Managed Care – PPO | Admitting: Family Medicine

## 2023-11-16 ENCOUNTER — Encounter: Payer: Self-pay | Admitting: Family Medicine

## 2023-11-16 DIAGNOSIS — E663 Overweight: Secondary | ICD-10-CM

## 2023-11-16 NOTE — Assessment & Plan Note (Signed)
 Encouraged to continue taking phentermine 37.5 mg daily. For optimal results with weight loss, I recommend:  Decreasing portion sizes. Reducing sugar, sodium, and carbohydrate intake, and limiting saturated fats in your diet. Increasing your fiber intake by incorporating more whole grains, fruits, and vegetables. Setting healthy goals and focusing on lowering carbs, sugar, and fat. Increasing the variety of fruits and vegetables in your diet. Reducing soda consumption and limiting processed foods. In addition to taking your weight loss medication, engage in moderate-intensity physical activity for at least 150 minutes per week for the best results.   Wt Readings from Last 3 Encounters:  11/16/23 149 lb 1.9 oz (67.6 kg)  10/06/23 153 lb 0.6 oz (69.4 kg)  09/04/23 159 lb 1.9 oz (72.2 kg)

## 2023-11-16 NOTE — Patient Instructions (Signed)
 I appreciate the opportunity to provide care to you today!    Follow up:  3 months   Start taking phentermine 37.5mg  daily. For optimal results with weight loss, I recommend:  Decreasing portion sizes. Reducing sugar, sodium, and carbohydrate intake, and limiting saturated fats in your diet. Increasing your fiber intake by incorporating more whole grains, fruits, and vegetables. Setting healthy goals and focusing on lowering carbs, sugar, and fat. Increasing the variety of fruits and vegetables in your diet. Reducing soda consumption and limiting processed foods. In addition to taking your weight loss medication, engage in moderate-intensity physical activity for at least 150 minutes per week for the best results.      Please continue to a heart-healthy diet and increase your physical activities. Try to exercise for at least five days a week.    It was a pleasure to see you and I look forward to continuing to work together on your health and well-being. Please do not hesitate to call the office if you need care or have questions about your care.  In case of emergency, please visit the Emergency Department for urgent care, or contact our clinic at [phone] to schedule an appointment. We're here to help you!   Have a wonderful day and week. With Gratitude, Gilmore Laroche MSN, FNP-BC

## 2023-11-16 NOTE — Progress Notes (Signed)
 Established Patient Office Visit  Subjective:  Patient ID: Patricia Norris, female    DOB: 12/28/1975  Age: 48 y.o. MRN: [id number]  CC:  Chief Complaint  Patient presents with   Care Management    HPI Patricia Norris is a 48 y.o. female presents foroverweight f/u. For the details of today's visit, please refer to the assessment and plan.    Past Medical History:  Diagnosis Date   Psoriasis     Past Surgical History:  Procedure Laterality Date   TONSILLECTOMY  04/30/2003    Family History  Problem Relation Age of Onset   Colon cancer Maternal Aunt     Social History   Socioeconomic History   Marital status: Married    Spouse name: Not on file   Number of children: Not on file   Years of education: Not on file   Highest education level: Not on file  Occupational History   Not on file  Tobacco Use   Smoking status: Never   Smokeless tobacco: Never  Substance and Sexual Activity   Alcohol use: Not Currently   Drug use: Never   Sexual activity: Not on file  Other Topics Concern   Not on file  Social History Narrative   Not on file   Social Drivers of Health   Financial Resource Strain: Not on file  Food Insecurity: Not on file  Transportation Needs: Not on file  Physical Activity: Not on file  Stress: Not on file  Social Connections: Not on file  Intimate Partner Violence: Not on file    Outpatient Medications Prior to Visit  Medication Sig Dispense Refill   Apremilast (OTEZLA) 30 MG TABS Take 30 mg by mouth daily.     Fexofenadine HCl (ALLEGRA ALLERGY PO) Take by mouth.     Fezolinetant (VEOZAH) 45 MG TABS Take 1 tablet (45 mg total) by mouth daily. 30 tablet 2   omeprazole (PRILOSEC) 40 MG capsule Take 1 capsule by mouth daily.  11   phentermine 37.5 MG capsule Take 1 capsule (37.5 mg total) by mouth every morning. 30 capsule 0   traZODone (DESYREL) 50 MG tablet Take 1 tablet (50 mg total) by mouth daily. 90 tablet 0   WELLBUTRIN XL 300 MG 24 hr  tablet Take 300 mg by mouth daily.     Chlorphen-PE-Acetaminophen (NOREL AD) 4-10-325 MG TABS Take 1 tablet every 4 hours while symptoms persists. Do not take more than 6 tablets in 24 hours. (Patient not taking: Reported on 11/16/2023) 20 tablet 0   No facility-administered medications prior to visit.    No Known Allergies  ROS Review of Systems  Constitutional:  Negative for chills and fever.  Eyes:  Negative for visual disturbance.  Respiratory:  Negative for chest tightness and shortness of breath.   Neurological:  Negative for dizziness and headaches.      Objective:    Physical Exam HENT:     Head: Normocephalic.     Mouth/Throat:     Mouth: Mucous membranes are moist.  Cardiovascular:     Rate and Rhythm: Normal rate.     Heart sounds: Normal heart sounds.  Pulmonary:     Effort: Pulmonary effort is normal.     Breath sounds: Normal breath sounds.  Neurological:     Mental Status: She is alert.     BP 118/68 (BP Location: Right Arm, Patient Position: Sitting, Cuff Size: Normal)   Pulse 78   Ht 5\' 5"  (1.651 m)  Wt 149 lb 1.9 oz (67.6 kg)   LMP 08/27/2020   SpO2 96%   BMI 24.81 kg/m  Wt Readings from Last 3 Encounters:  11/16/23 149 lb 1.9 oz (67.6 kg)  10/06/23 153 lb 0.6 oz (69.4 kg)  09/04/23 159 lb 1.9 oz (72.2 kg)    Lab Results  Component Value Date   TSH 1.930 08/09/2023   Lab Results  Component Value Date   WBC 4.5 08/09/2023   HGB 13.9 08/09/2023   HCT 42.9 08/09/2023   MCV 92 08/09/2023   PLT 314 08/09/2023   Lab Results  Component Value Date   NA 139 08/09/2023   K 5.2 08/09/2023   CO2 26 08/09/2023   GLUCOSE 105 (H) 08/09/2023   BUN 13 08/09/2023   CREATININE 0.99 08/09/2023   BILITOT 0.3 08/09/2023   ALKPHOS 61 08/09/2023   AST 24 08/09/2023   ALT 36 (H) 08/09/2023   PROT 7.2 08/09/2023   ALBUMIN 4.9 08/09/2023   CALCIUM 10.0 08/09/2023   ANIONGAP 11 05/25/2022   EGFR 70 08/09/2023   Lab Results  Component Value Date    CHOL 164 08/09/2023   Lab Results  Component Value Date   HDL 68 08/09/2023   Lab Results  Component Value Date   LDLCALC 86 08/09/2023   Lab Results  Component Value Date   TRIG 50 08/09/2023   Lab Results  Component Value Date   CHOLHDL 2.4 08/09/2023   Lab Results  Component Value Date   HGBA1C 5.7 (H) 08/09/2023      Assessment & Plan:  Overweight (BMI 25.0-29.9) Assessment & Plan: Encouraged to continue taking phentermine 37.5 mg daily. For optimal results with weight loss, I recommend:  Decreasing portion sizes. Reducing sugar, sodium, and carbohydrate intake, and limiting saturated fats in your diet. Increasing your fiber intake by incorporating more whole grains, fruits, and vegetables. Setting healthy goals and focusing on lowering carbs, sugar, and fat. Increasing the variety of fruits and vegetables in your diet. Reducing soda consumption and limiting processed foods. In addition to taking your weight loss medication, engage in moderate-intensity physical activity for at least 150 minutes per week for the best results.   Wt Readings from Last 3 Encounters:  11/16/23 149 lb 1.9 oz (67.6 kg)  10/06/23 153 lb 0.6 oz (69.4 kg)  09/04/23 159 lb 1.9 oz (72.2 kg)      Note: This chart has been completed using Engineer, civil (consulting) software, and while attempts have been made to ensure accuracy, certain words and phrases may not be transcribed as intended.    Follow-up: Return in about 3 months (around 02/16/2024).   Gilmore Laroche, FNP

## 2023-11-25 ENCOUNTER — Other Ambulatory Visit: Payer: Self-pay | Admitting: Family Medicine

## 2023-11-25 DIAGNOSIS — E663 Overweight: Secondary | ICD-10-CM

## 2023-11-27 ENCOUNTER — Other Ambulatory Visit: Payer: Self-pay | Admitting: Family Medicine

## 2023-12-03 ENCOUNTER — Other Ambulatory Visit: Payer: Self-pay | Admitting: Family Medicine

## 2023-12-03 DIAGNOSIS — E663 Overweight: Secondary | ICD-10-CM

## 2023-12-03 DIAGNOSIS — G4700 Insomnia, unspecified: Secondary | ICD-10-CM

## 2023-12-03 MED ORDER — PHENTERMINE HCL 37.5 MG PO CAPS: 37.5000 mg | ORAL_CAPSULE | ORAL | 0 refills | Status: DC

## 2023-12-11 ENCOUNTER — Other Ambulatory Visit: Payer: Self-pay | Admitting: Family Medicine

## 2023-12-11 DIAGNOSIS — Z1211 Encounter for screening for malignant neoplasm of colon: Secondary | ICD-10-CM | POA: Diagnosis not present

## 2023-12-11 DIAGNOSIS — K2289 Other specified disease of esophagus: Secondary | ICD-10-CM | POA: Diagnosis not present

## 2023-12-11 DIAGNOSIS — K2950 Unspecified chronic gastritis without bleeding: Secondary | ICD-10-CM | POA: Diagnosis not present

## 2023-12-11 DIAGNOSIS — R1013 Epigastric pain: Secondary | ICD-10-CM | POA: Diagnosis not present

## 2023-12-11 DIAGNOSIS — E663 Overweight: Secondary | ICD-10-CM

## 2023-12-11 DIAGNOSIS — K200 Eosinophilic esophagitis: Secondary | ICD-10-CM | POA: Diagnosis not present

## 2023-12-11 DIAGNOSIS — K3189 Other diseases of stomach and duodenum: Secondary | ICD-10-CM | POA: Diagnosis not present

## 2023-12-11 DIAGNOSIS — K219 Gastro-esophageal reflux disease without esophagitis: Secondary | ICD-10-CM | POA: Diagnosis not present

## 2023-12-11 DIAGNOSIS — Z860101 Personal history of adenomatous and serrated colon polyps: Secondary | ICD-10-CM | POA: Diagnosis not present

## 2023-12-11 DIAGNOSIS — K635 Polyp of colon: Secondary | ICD-10-CM | POA: Diagnosis not present

## 2023-12-11 LAB — HM COLONOSCOPY

## 2023-12-28 DIAGNOSIS — L400 Psoriasis vulgaris: Secondary | ICD-10-CM | POA: Diagnosis not present

## 2024-01-01 DIAGNOSIS — Z6826 Body mass index (BMI) 26.0-26.9, adult: Secondary | ICD-10-CM | POA: Diagnosis not present

## 2024-01-01 DIAGNOSIS — Z1231 Encounter for screening mammogram for malignant neoplasm of breast: Secondary | ICD-10-CM | POA: Diagnosis not present

## 2024-01-01 DIAGNOSIS — Z1321 Encounter for screening for nutritional disorder: Secondary | ICD-10-CM | POA: Diagnosis not present

## 2024-01-01 DIAGNOSIS — Z124 Encounter for screening for malignant neoplasm of cervix: Secondary | ICD-10-CM | POA: Diagnosis not present

## 2024-01-01 DIAGNOSIS — Z13228 Encounter for screening for other metabolic disorders: Secondary | ICD-10-CM | POA: Diagnosis not present

## 2024-01-01 DIAGNOSIS — Z131 Encounter for screening for diabetes mellitus: Secondary | ICD-10-CM | POA: Diagnosis not present

## 2024-01-01 DIAGNOSIS — Z130 Encounter for screening for diseases of the blood and blood-forming organs and certain disorders involving the immune mechanism: Secondary | ICD-10-CM | POA: Diagnosis not present

## 2024-01-01 DIAGNOSIS — Z01419 Encounter for gynecological examination (general) (routine) without abnormal findings: Secondary | ICD-10-CM | POA: Diagnosis not present

## 2024-01-08 ENCOUNTER — Other Ambulatory Visit: Payer: Self-pay

## 2024-01-08 ENCOUNTER — Other Ambulatory Visit: Payer: Self-pay | Admitting: Family Medicine

## 2024-01-08 ENCOUNTER — Encounter: Payer: Self-pay | Admitting: Family Medicine

## 2024-01-08 DIAGNOSIS — E663 Overweight: Secondary | ICD-10-CM

## 2024-01-08 DIAGNOSIS — N951 Menopausal and female climacteric states: Secondary | ICD-10-CM

## 2024-01-08 MED ORDER — PHENTERMINE HCL 37.5 MG PO CAPS: 37.5000 mg | ORAL_CAPSULE | ORAL | 0 refills | Status: DC

## 2024-01-08 MED ORDER — VEOZAH 45 MG PO TABS: 45.0000 mg | ORAL_TABLET | Freq: Every day | ORAL | 2 refills | Status: AC

## 2024-01-08 NOTE — Telephone Encounter (Signed)
 Asking for refill of veozah  to [facility]. Not on current med list but is prescribed by Art Bigness. It must have fallen off with the 4/7 end date. Pls advise

## 2024-01-08 NOTE — Telephone Encounter (Signed)
 Please inform the patient that I am currently out of the office and all refills will be addressed upon my return on 01/15/2024

## 2024-01-09 ENCOUNTER — Other Ambulatory Visit: Payer: Self-pay | Admitting: Family Medicine

## 2024-01-09 DIAGNOSIS — N951 Menopausal and female climacteric states: Secondary | ICD-10-CM

## 2024-01-10 ENCOUNTER — Other Ambulatory Visit (HOSPITAL_COMMUNITY): Payer: Self-pay

## 2024-01-10 ENCOUNTER — Telehealth: Payer: Self-pay | Admitting: Pharmacy Technician

## 2024-01-10 NOTE — Telephone Encounter (Signed)
 Pharmacy Patient Advocate Encounter   Received notification from CoverMyMeds that prior authorization for Phentermine  HCl 37.5MG  capsules is required/requested.   Insurance verification completed.   The patient is insured through [facility] .   Per test claim:

## 2024-02-07 ENCOUNTER — Other Ambulatory Visit: Payer: Self-pay | Admitting: Family Medicine

## 2024-02-07 DIAGNOSIS — E663 Overweight: Secondary | ICD-10-CM

## 2024-02-08 NOTE — Telephone Encounter (Signed)
 Rx sent.

## 2024-02-19 ENCOUNTER — Ambulatory Visit: Admitting: Family Medicine

## 2024-03-09 ENCOUNTER — Other Ambulatory Visit: Payer: Self-pay | Admitting: Family Medicine

## 2024-03-09 DIAGNOSIS — E663 Overweight: Secondary | ICD-10-CM

## 2024-03-12 ENCOUNTER — Encounter: Payer: Self-pay | Admitting: Family Medicine

## 2024-03-17 ENCOUNTER — Other Ambulatory Visit: Payer: Self-pay | Admitting: Family Medicine

## 2024-04-13 ENCOUNTER — Other Ambulatory Visit: Payer: Self-pay | Admitting: Family Medicine

## 2024-04-13 DIAGNOSIS — G4700 Insomnia, unspecified: Secondary | ICD-10-CM

## 2024-04-15 ENCOUNTER — Encounter: Payer: Self-pay | Admitting: Family Medicine

## 2024-04-15 ENCOUNTER — Ambulatory Visit (INDEPENDENT_AMBULATORY_CARE_PROVIDER_SITE_OTHER): Payer: Self-pay | Admitting: Family Medicine

## 2024-04-15 VITALS — BP 151/89 | HR 74 | Ht 65.0 in | Wt 160.0 lb

## 2024-04-15 DIAGNOSIS — J0100 Acute maxillary sinusitis, unspecified: Secondary | ICD-10-CM

## 2024-04-15 DIAGNOSIS — E663 Overweight: Secondary | ICD-10-CM

## 2024-04-15 MED ORDER — PREDNISONE 20 MG PO TABS: 40.0000 mg | ORAL_TABLET | Freq: Every day | ORAL | 0 refills | Status: AC

## 2024-04-15 MED ORDER — WEGOVY 0.25 MG/0.5ML ~~LOC~~ SOAJ: 0.2500 mg | SUBCUTANEOUS | 0 refills | Status: DC

## 2024-04-15 MED ORDER — AMOXICILLIN-POT CLAVULANATE 875-125 MG PO TABS: 1.0000 | ORAL_TABLET | Freq: Two times a day (BID) | ORAL | 0 refills | Status: AC

## 2024-04-15 NOTE — Progress Notes (Unsigned)
 Acute Office Visit  Subjective:    Patient ID: Patricia Norris, female    DOB: Nov 17, 1975, 48 y.o.   MRN: [id number]  Chief Complaint  Patient presents with   Sinusitis    Sinus headache, pressure, ears stopping up, congestion. Has been taking mucinex and tylenol    Obesity    Phentermine  follow up     HPI The patient presents today with a 2-week history of sinus infection symptoms. She reports sinus pain and pressure, a low-grade fever of 100.63F, nasal congestion, and ear pain. She has been taking over-the-counter medications with minimal relief.  Additionally, the patient reports that her weight has plateaued while taking phentermine  and expresses interest in exploring alternative weight loss options.   Wt Readings from Last 3 Encounters:  04/15/24 160 lb (72.6 kg)  11/16/23 149 lb 1.9 oz (67.6 kg)  10/06/23 153 lb 0.6 oz (69.4 kg)     Past Medical History:  Diagnosis Date   Psoriasis     Past Surgical History:  Procedure Laterality Date   TONSILLECTOMY  04/30/2003    Family History  Problem Relation Age of Onset   Colon cancer Maternal Aunt     Social History   Socioeconomic History   Marital status: Married    Spouse name: Not on file   Number of children: Not on file   Years of education: Not on file   Highest education level: Not on file  Occupational History   Not on file  Tobacco Use   Smoking status: Never   Smokeless tobacco: Never  Substance and Sexual Activity   Alcohol use: Not Currently   Drug use: Never   Sexual activity: Not on file  Other Topics Concern   Not on file  Social History Narrative   Not on file   Social Drivers of Health   Financial Resource Strain: Not on file  Food Insecurity: Not on file  Transportation Needs: Not on file  Physical Activity: Not on file  Stress: Not on file  Social Connections: Not on file  Intimate Partner Violence: Not on file    Outpatient Medications Prior to Visit  Medication Sig Dispense  Refill   Apremilast (OTEZLA) 30 MG TABS Take 30 mg by mouth daily.     Fexofenadine HCl (ALLEGRA ALLERGY PO) Take by mouth.     Fezolinetant  (VEOZAH ) 45 MG TABS Take 1 tablet (45 mg total) by mouth daily. 90 tablet 2   omeprazole (PRILOSEC) 40 MG capsule Take 1 capsule by mouth daily.  11   phentermine  37.5 MG capsule TAKE 1 CAPSULE(37.5 MG) BY MOUTH EVERY MORNING 30 capsule 0   traZODone  (DESYREL ) 50 MG tablet TAKE 1 TABLET BY MOUTH EVERY DAY 90 tablet 0   WELLBUTRIN XL 300 MG 24 hr tablet Take 300 mg by mouth daily.     No facility-administered medications prior to visit.    No Known Allergies  Review of Systems  Constitutional:  Negative for chills and fever.  HENT:  Positive for congestion, ear pain, sinus pressure and sinus pain.   Eyes:  Negative for visual disturbance.  Respiratory:  Negative for chest tightness and shortness of breath.   Neurological:  Negative for dizziness and headaches.       Objective:    Physical Exam HENT:     Head: Normocephalic.     Nose:     Right Sinus: Maxillary sinus tenderness present.     Left Sinus: Maxillary sinus tenderness present.  Mouth/Throat:     Mouth: Mucous membranes are moist.  Cardiovascular:     Rate and Rhythm: Normal rate.     Heart sounds: Normal heart sounds.  Pulmonary:     Effort: Pulmonary effort is normal.     Breath sounds: Normal breath sounds.  Neurological:     Mental Status: She is alert.     BP (!) 151/89   Pulse 74   Ht 5' 5 (1.651 m)   Wt 160 lb (72.6 kg)   LMP 08/27/2020   SpO2 96%   BMI 26.63 kg/m  Wt Readings from Last 3 Encounters:  04/15/24 160 lb (72.6 kg)  11/16/23 149 lb 1.9 oz (67.6 kg)  10/06/23 153 lb 0.6 oz (69.4 kg)       Assessment & Plan:  Subacute maxillary sinusitis Assessment & Plan: Start Prednisone  40 mg daily for 5 days. -Start Augmentin  875-125 mg BID for 5 days. -Take Tylenol as needed for pain, fever, or general discomfort.  Supportive Care -Increase  fluid intake and allow for plenty of rest. -Perform warm saltwater gargles ( teaspoon of salt in a glass of warm water, 3-4 times daily) to reduce throat inflammation and irritation. -Use ginger tea to soothe throat irritation and reduce nausea. -Consider sugar-free or honey-based throat lozenges for sore throat relief. -Use a humidifier at bedtime to help with  nasal congestion. -Apply heated humidified air and saline nasal sprays for nasal congestion relief.    Orders: -     Amoxicillin -Pot Clavulanate; Take 1 tablet by mouth 2 (two) times daily for 5 days.  Dispense: 10 tablet; Refill: 0 -     predniSONE ; Take 2 tablets (40 mg total) by mouth daily for 5 days.  Dispense: 10 tablet; Refill: 0  Overweight (BMI 25.0-29.9) Assessment & Plan: The patient's current weight meets the criteria for treatment with Wegovy  or Zepbound. However, she has requested that I submit a prescription for Wegovy  to determine if her insurance will provide coverage. At this time, I recommend initiating a "holiday" from phentermine  and focusing on sustainable lifestyle modifications.  Decrease portion sizes. Reduce intake of sugar, sodium, and carbohydrates, and limit saturated fats. Increase fiber by incorporating more whole grains, fruits, and vegetables. Set healthy goals with a focus on lowering carbs, sugar, and fat. Add variety to your diet by including different types of fruits and vegetables. Reduce soda consumption and limit processed foods. In addition, engage in at least 150 minutes of moderate-intensity physical activity per week for the best weight loss results.   Orders: -     Wegovy ; Inject 0.25 mg into the skin once a week.  Dispense: 2 mL; Refill: 0  Note: This chart has been completed using [employer] (consulting) software, and while attempts have been made to ensure accuracy, certain words and phrases may not be transcribed as intended.    Skippy Marhefka, FNP

## 2024-04-15 NOTE — Patient Instructions (Addendum)
 I appreciate the opportunity to provide care to you today!    Sinusitis  -Start Prednisone  40 mg daily for 5 days. -Start Augmentin  875-125 mg BID for 5 days. -Take Tylenol as needed for pain, fever, or general discomfort.  Supportive Care -Increase fluid intake and allow for plenty of rest. -Perform warm saltwater gargles ( teaspoon of salt in a glass of warm water, 3-4 times daily) to reduce throat inflammation and irritation. -Use ginger tea to soothe throat irritation and reduce nausea. -Consider sugar-free or honey-based throat lozenges for sore throat relief. -Use a humidifier at bedtime to help with  nasal congestion. -Apply heated humidified air and saline nasal sprays for nasal congestion relief.  For optimal results with weight loss, I recommend:  Decreasing portion sizes. Reducing sugar, sodium, and carbohydrate intake, and limiting saturated fats in your diet. Increasing your fiber intake by incorporating more whole grains, fruits, and vegetables. Setting healthy goals and focusing on lowering carbs, sugar, and fat. Increasing the variety of fruits and vegetables in your diet. Reducing soda consumption and limiting processed foods. In addition to taking your weight loss medication, engage in moderate-intensity physical activity for at least 150 minutes per week for the best results.   Please follow up if your symptoms worsen or fail to improve.    Please continue to a heart-healthy diet and increase your physical activities. Try to exercise for at least five days a week.    It was a pleasure to see you and I look forward to continuing to work together on your health and well-being. Please do not hesitate to call the office if you need care or have questions about your care.  In case of emergency, please visit the Emergency Department for urgent care, or contact our clinic at [phone] to schedule an appointment. We're here to help you!   Have a wonderful  day and week. With Gratitude, Jashan Cotten MSN, FNP-BC

## 2024-04-16 NOTE — Assessment & Plan Note (Addendum)
 The patient's current weight meets the criteria for treatment with Wegovy  or Zepbound. However, she has requested that I submit a prescription for Wegovy  to determine if her insurance will provide coverage. At this time, I recommend initiating a "holiday" from phentermine  and focusing on sustainable lifestyle modifications.  Decrease portion sizes. Reduce intake of sugar, sodium, and carbohydrates, and limit saturated fats. Increase fiber by incorporating more whole grains, fruits, and vegetables. Set healthy goals with a focus on lowering carbs, sugar, and fat. Add variety to your diet by including different types of fruits and vegetables. Reduce soda consumption and limit processed foods. In addition, engage in at least 150 minutes of moderate-intensity physical activity per week for the best weight loss results.

## 2024-04-16 NOTE — Assessment & Plan Note (Signed)
 Start Prednisone  40 mg daily for 5 days. -Start Augmentin  875-125 mg BID for 5 days. -Take Tylenol as needed for pain, fever, or general discomfort.  Supportive Care -Increase fluid intake and allow for plenty of rest. -Perform warm saltwater gargles ( teaspoon of salt in a glass of warm water, 3-4 times daily) to reduce throat inflammation and irritation. -Use ginger tea to soothe throat irritation and reduce nausea. -Consider sugar-free or honey-based throat lozenges for sore throat relief. -Use a humidifier at bedtime to help with  nasal congestion. -Apply heated humidified air and saline nasal sprays for nasal congestion relief.

## 2024-04-17 ENCOUNTER — Other Ambulatory Visit: Payer: Self-pay | Admitting: Family Medicine

## 2024-04-17 DIAGNOSIS — G4700 Insomnia, unspecified: Secondary | ICD-10-CM

## 2024-05-16 ENCOUNTER — Other Ambulatory Visit: Payer: Self-pay | Admitting: Family Medicine

## 2024-05-16 DIAGNOSIS — E663 Overweight: Secondary | ICD-10-CM

## 2024-05-16 MED ORDER — WEGOVY 0.5 MG/0.5ML ~~LOC~~ SOAJ: 0.5000 mg | SUBCUTANEOUS | 0 refills | Status: DC

## 2024-05-16 NOTE — Telephone Encounter (Signed)
 Rx sent.

## 2024-05-16 NOTE — Telephone Encounter (Signed)
 Please send in Wegovy  refill or increase to next dose

## 2024-06-06 DIAGNOSIS — M79672 Pain in left foot: Secondary | ICD-10-CM | POA: Diagnosis not present

## 2024-06-06 DIAGNOSIS — M7742 Metatarsalgia, left foot: Secondary | ICD-10-CM | POA: Diagnosis not present

## 2024-06-06 DIAGNOSIS — M778 Other enthesopathies, not elsewhere classified: Secondary | ICD-10-CM | POA: Diagnosis not present

## 2024-06-21 ENCOUNTER — Encounter: Payer: Self-pay | Admitting: Family Medicine

## 2024-06-23 ENCOUNTER — Other Ambulatory Visit: Payer: Self-pay | Admitting: Family Medicine

## 2024-06-23 DIAGNOSIS — E663 Overweight: Secondary | ICD-10-CM

## 2024-06-23 MED ORDER — WEGOVY 1 MG/0.5ML ~~LOC~~ SOAJ: 1.0000 mg | SUBCUTANEOUS | 0 refills | Status: DC

## 2024-06-27 ENCOUNTER — Telehealth: Payer: Self-pay | Admitting: Pharmacy Technician

## 2024-06-27 ENCOUNTER — Other Ambulatory Visit (HOSPITAL_COMMUNITY): Payer: Self-pay

## 2024-06-27 NOTE — Telephone Encounter (Signed)
 Pharmacy Patient Advocate Encounter   Received notification from CoverMyMeds that prior authorization for Wegovy  1MG /0.5ML auto-injectors is required/requested.   Insurance verification completed.   The patient is insured through [facility].   Per test claim:     Called [facility] and they advised that patient fills the prescription on a manufacture discount card and pays $499. No further PA needed at this time.

## 2024-07-06 ENCOUNTER — Telehealth: Admitting: Family Medicine

## 2024-07-06 DIAGNOSIS — N760 Acute vaginitis: Secondary | ICD-10-CM

## 2024-07-06 MED ORDER — METRONIDAZOLE 0.75 % VA GEL: 1.0000 | Freq: Every day | VAGINAL | Status: DC

## 2024-07-06 MED ORDER — METRONIDAZOLE 0.75 % VA GEL: 1.0000 | Freq: Every day | VAGINAL | 0 refills | Status: AC

## 2024-07-06 NOTE — Progress Notes (Signed)
 We are sorry that you are not feeling well. Here is how we plan to help! Based on what you shared with me it looks like you: May have a vaginosis due to bacteria  Vaginosis is an inflammation of the vagina that can result in discharge, itching and pain. The cause is usually a change in the normal balance of vaginal bacteria or an infection. Vaginosis can also result from reduced estrogen levels after menopause.  The most common causes of vaginosis are:   Bacterial vaginosis which results from an overgrowth of one on several organisms that are normally present in your vagina.   Yeast infections which are caused by a naturally occurring fungus called candida.   Vaginal atrophy (atrophic vaginosis) which results from the thinning of the vagina from reduced estrogen levels after menopause.   Trichomoniasis which is caused by a parasite and is commonly transmitted by sexual intercourse.  Factors that increase your risk of developing vaginosis include: Medications, such as antibiotics and steroids Uncontrolled diabetes Use of hygiene products such as bubble bath, vaginal spray or vaginal deodorant Douching Wearing damp or tight-fitting clothing Using an intrauterine device (IUD) for birth control Hormonal changes, such as those associated with pregnancy, birth control pills or menopause Sexual activity Having a sexually transmitted infection  Your treatment plan is Metronidazole gel for 5 days.  I have electronically sent this prescription into the pharmacy that you have chosen.  Be sure to take all of the medication as directed. Stop taking any medication if you develop a rash, tongue swelling or shortness of breath. Mothers who are breast feeding should consider pumping and discarding their breast milk while on these antibiotics. However, there is no consensus that infant exposure at these doses would be harmful.  Remember that medication creams can weaken latex condoms.   HOME  CARE:  Good hygiene may prevent some types of vaginosis from recurring and may relieve some symptoms:  Avoid baths, hot tubs and whirlpool spas. Rinse soap from your outer genital area after a shower, and dry the area well to prevent irritation. Don't use scented or harsh soaps, such as those with deodorant or antibacterial action. Avoid irritants. These include scented tampons and pads. Wipe from front to back after using the toilet. Doing so avoids spreading fecal bacteria to your vagina.  Other things that may help prevent vaginosis include:  Don't douche. Your vagina doesn't require cleansing other than normal bathing. Repetitive douching disrupts the normal organisms that reside in the vagina and can actually increase your risk of vaginal infection. Douching won't clear up a vaginal infection. Use a latex condom. Both female and female latex condoms may help you avoid infections spread by sexual contact. Wear cotton underwear. Also wear pantyhose with a cotton crotch. If you feel comfortable without it, skip wearing underwear to bed. Yeast thrives in [place] Your symptoms should improve in the next day or two.  GET HELP RIGHT AWAY IF:  You have pain in your lower abdomen ( pelvic area or over your ovaries) You develop nausea or vomiting You develop a fever Your discharge changes or worsens You have persistent pain with intercourse You develop shortness of breath, a rapid pulse, or you faint.  These symptoms could be signs of problems or infections that need to be evaluated by a medical provider now.  MAKE SURE YOU   Understand these instructions. Will watch your condition. Will get help right away if you are not doing well or get worse.  Your e-visit answers were reviewed by a board certified advanced clinical practitioner to complete your personal care plan. Depending upon the condition, your plan could have included both over the counter or prescription  medications. Please review your pharmacy choice to make sure that you have choses a pharmacy that is open for you to pick up any needed prescription, Your safety is important to us . If you have drug allergies check your prescription carefully.   You can use MyChart to ask questions about today's visit, request a non-urgent call back, or ask for a work or school excuse for 24 hours related to this e-Visit. If it has been greater than 24 hours you will need to follow up with your provider, or enter a new e-Visit to address those concerns. You will get a MyChart message within the next two days asking about your experience. I hope that your e-visit has been valuable and will speed your recovery.  I have spent 5 minutes in review of e-visit questionnaire, review and updating patient chart, medical decision making and response to patient.   Roosvelt Mater, PA-C

## 2024-07-17 ENCOUNTER — Other Ambulatory Visit: Payer: Self-pay | Admitting: Family Medicine

## 2024-07-17 DIAGNOSIS — G4700 Insomnia, unspecified: Secondary | ICD-10-CM

## 2024-07-18 ENCOUNTER — Other Ambulatory Visit: Payer: Self-pay | Admitting: Family Medicine

## 2024-07-18 DIAGNOSIS — E663 Overweight: Secondary | ICD-10-CM

## 2024-07-18 MED ORDER — WEGOVY 1.7 MG/0.75ML ~~LOC~~ SOAJ: 1.7000 mg | SUBCUTANEOUS | 0 refills | Status: DC

## 2024-07-18 NOTE — Telephone Encounter (Signed)
 Rx sent.

## 2024-08-14 ENCOUNTER — Other Ambulatory Visit: Payer: Self-pay | Admitting: Family Medicine

## 2024-08-14 DIAGNOSIS — E663 Overweight: Secondary | ICD-10-CM

## 2024-08-14 MED ORDER — WEGOVY 2.4 MG/0.75ML ~~LOC~~ SOAJ: 2.4000 mg | SUBCUTANEOUS | 0 refills | Status: DC

## 2024-08-14 NOTE — Telephone Encounter (Signed)
 Rx sent.

## 2024-09-14 ENCOUNTER — Other Ambulatory Visit: Payer: Self-pay | Admitting: Family Medicine

## 2024-09-14 DIAGNOSIS — E663 Overweight: Secondary | ICD-10-CM
